# Patient Record
Sex: Male | Born: 1950 | Race: White | Hispanic: No | Marital: Married | Smoking: Smoker, current status unknown
Health system: Southern US, Community
[De-identification: ages and names within clinical notes are randomized; demographics above are authoritative.]

## PROBLEM LIST (undated history)

## (undated) DIAGNOSIS — I10 Essential (primary) hypertension: Secondary | ICD-10-CM

## (undated) DIAGNOSIS — Z950 Presence of cardiac pacemaker: Secondary | ICD-10-CM

## (undated) DIAGNOSIS — I219 Acute myocardial infarction, unspecified: Secondary | ICD-10-CM

## (undated) DIAGNOSIS — I495 Sick sinus syndrome: Secondary | ICD-10-CM

## (undated) DIAGNOSIS — I2510 Atherosclerotic heart disease of native coronary artery without angina pectoris: Secondary | ICD-10-CM

## (undated) DIAGNOSIS — M1990 Unspecified osteoarthritis, unspecified site: Secondary | ICD-10-CM

## (undated) HISTORY — PX: OTHER SURGICAL HISTORY: SHX169

## (undated) HISTORY — PX: ELBOW SURGERY: SHX618

## (undated) HISTORY — PX: JOINT REPLACEMENT: SHX530

## (undated) HISTORY — PX: HAMMER TOE SURGERY: SHX385

## (undated) HISTORY — PX: TONSILLECTOMY: SUR1361

---

## 2011-09-20 ENCOUNTER — Encounter (HOSPITAL_COMMUNITY): Payer: Self-pay | Admitting: Pharmacy Technician

## 2011-09-20 ENCOUNTER — Encounter (HOSPITAL_COMMUNITY): Payer: Self-pay

## 2011-09-20 ENCOUNTER — Encounter (HOSPITAL_COMMUNITY)
Admission: RE | Admit: 2011-09-20 | Discharge: 2011-09-20 | Disposition: A | Discharge status: Home / Self Care | Payer: PRIVATE HEALTH INSURANCE | Source: Ambulatory Visit | Attending: Orthopedic Surgery | Admitting: Orthopedic Surgery

## 2011-09-20 HISTORY — DX: Essential (primary) hypertension: I10

## 2011-09-20 HISTORY — DX: Unspecified osteoarthritis, unspecified site: M19.90

## 2011-09-20 NOTE — Pre-Procedure Instructions (Signed)
NORMAL CARDIOLITE STRESS TEST REPORT 08/08/11--REPORT ON CHART FROM [facility] 4 EKG'S FROM [facility] DATED 08/07/11 ON THIS CHART CXR REPORT 08/07/11 ON CHART FROM [facility] CBC WITH DIFF, CMET, AND URINALYSIS REPORTS ON CHART FROM DR. GRISSO WITH [facility] IN [place]--AND MEDICAL CLEARANCE NOTE FOR RIGHT TKA SURGERY SCHEDULED ON 09/29/11 WITH DR. OLIN PT, PTT AND PCR WERE DONE TODAY PREOP AT [facility]--PT WANTS T/S DRAWN ON ARRIVAL FOR HIS SURGERY.

## 2011-09-20 NOTE — Patient Instructions (Signed)
20 SUHAN PACI  09/20/2011   Your procedure is scheduled on:  Friday 12/21  AT 16:30  Report to [facility] at 2:00 PM.  Call this number if you have problems the morning of surgery: [phone]     Do not eat food:After Midnight.  May have clear liquids FROM MIDNIGHT UNTIL 10:OO AM THE DAY OF SURGERY  Clear liquids include soda, tea, black coffee, apple or grape juice.  NOTHING TO DRINK AFTER 10:OO AM DAY OF SURGERY     Do not wear jewelry, make-up or nail polish.  Do not wear lotions, powders, or perfumes. You may wear deodorant.  Do not shave 48 hours prior to surgery.  Do not bring valuables to the hospital.  Contacts, dentures or bridgework may not be worn into surgery.  Leave suitcase in the car. After surgery it may be brought to your room.  For patients admitted to the hospital, checkout time is 11:00 AM the day of discharge.   Patients discharged the day of surgery will not be allowed to drive home.    Special Instructions: CHG Shower Use Special Wash: 1/2 bottle night before surgery and 1/2 bottle morning of surgery.   Please read over the following fact sheets that you were given: Blood Transfusion Information and MRSA Information AND INCENTIVE SPIROMETER INSTRUCTIONS
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
## 2011-09-29 ENCOUNTER — Encounter (HOSPITAL_COMMUNITY): Payer: Self-pay

## 2011-09-29 ENCOUNTER — Encounter (HOSPITAL_COMMUNITY): Payer: Self-pay | Admitting: Anesthesiology

## 2011-09-29 ENCOUNTER — Inpatient Hospital Stay (HOSPITAL_COMMUNITY)
Admission: RE | Admit: 2011-09-29 | Discharge: 2011-10-01 | DRG: 470 | Disposition: A | Discharge status: Home Health | Payer: PRIVATE HEALTH INSURANCE | Source: Ambulatory Visit | Attending: Orthopedic Surgery | Admitting: Orthopedic Surgery

## 2011-09-29 ENCOUNTER — Encounter (HOSPITAL_COMMUNITY)
Admission: RE | Disposition: A | Discharge status: Home Health | Payer: Self-pay | Source: Ambulatory Visit | Attending: Orthopedic Surgery

## 2011-09-29 ENCOUNTER — Inpatient Hospital Stay (HOSPITAL_COMMUNITY): Payer: PRIVATE HEALTH INSURANCE | Admitting: Anesthesiology

## 2011-09-29 ENCOUNTER — Encounter (HOSPITAL_COMMUNITY): Payer: Self-pay | Admitting: *Deleted

## 2011-09-29 DIAGNOSIS — Z96659 Presence of unspecified artificial knee joint: Secondary | ICD-10-CM

## 2011-09-29 DIAGNOSIS — I10 Essential (primary) hypertension: Secondary | ICD-10-CM | POA: Diagnosis present

## 2011-09-29 DIAGNOSIS — F17200 Nicotine dependence, unspecified, uncomplicated: Secondary | ICD-10-CM | POA: Diagnosis present

## 2011-09-29 DIAGNOSIS — Z01812 Encounter for preprocedural laboratory examination: Secondary | ICD-10-CM

## 2011-09-29 DIAGNOSIS — M1710 Unilateral primary osteoarthritis, unspecified knee: Principal | ICD-10-CM | POA: Diagnosis present

## 2011-09-29 HISTORY — PX: TOTAL KNEE ARTHROPLASTY: SHX125

## 2011-09-29 LAB — TYPE AND SCREEN
ABO/RH(D): A POS
Antibody Screen: NEGATIVE

## 2011-09-29 SURGERY — ARTHROPLASTY, KNEE, TOTAL
Anesthesia: Spinal | Site: Knee | Laterality: Right | Wound class: Clean

## 2011-09-29 MED ORDER — RIVAROXABAN 10 MG PO TABS
10.0000 mg | ORAL_TABLET | ORAL | Status: DC
Start: 2011-09-30 — End: 2011-10-01
  Administered 2011-09-30 – 2011-10-01 (×2): 10 mg via ORAL
  Filled 2011-09-29 (×2): qty 1

## 2011-09-29 MED ORDER — FERROUS SULFATE 325 (65 FE) MG PO TABS
325.0000 mg | ORAL_TABLET | Freq: Three times a day (TID) | ORAL | Status: DC
  Administered 2011-09-29 – 2011-10-01 (×6): 325 mg via ORAL
  Filled 2011-09-29 (×7): qty 1

## 2011-09-29 MED ORDER — ONDANSETRON HCL 4 MG/2ML IJ SOLN: 4.0000 mg | Freq: Four times a day (QID) | INTRAMUSCULAR | Status: DC | PRN

## 2011-09-29 MED ORDER — PROMETHAZINE HCL 25 MG/ML IJ SOLN: 6.2500 mg | INTRAMUSCULAR | Status: DC | PRN

## 2011-09-29 MED ORDER — LISINOPRIL 20 MG PO TABS
20.0000 mg | ORAL_TABLET | Freq: Every day | ORAL | Status: DC
  Administered 2011-09-30 – 2011-10-01 (×2): 20 mg via ORAL
  Filled 2011-09-29 (×2): qty 1

## 2011-09-29 MED ORDER — HYDROMORPHONE HCL PF 1 MG/ML IJ SOLN: 0.2500 mg | INTRAMUSCULAR | Status: DC | PRN

## 2011-09-29 MED ORDER — CEFAZOLIN SODIUM 1-5 GM-% IV SOLN
INTRAVENOUS | Status: DC | PRN
  Administered 2011-09-29: 2 g via INTRAVENOUS

## 2011-09-29 MED ORDER — ONDANSETRON HCL 4 MG PO TABS: 4.0000 mg | ORAL_TABLET | Freq: Four times a day (QID) | ORAL | Status: DC | PRN

## 2011-09-29 MED ORDER — ACETAMINOPHEN 325 MG PO TABS: 650.0000 mg | ORAL_TABLET | Freq: Four times a day (QID) | ORAL | Status: DC | PRN

## 2011-09-29 MED ORDER — METOCLOPRAMIDE HCL 5 MG/ML IJ SOLN: 5.0000 mg | Freq: Three times a day (TID) | INTRAMUSCULAR | Status: DC | PRN

## 2011-09-29 MED ORDER — BISACODYL 5 MG PO TBEC: 5.0000 mg | DELAYED_RELEASE_TABLET | Freq: Every day | ORAL | Status: DC | PRN

## 2011-09-29 MED ORDER — METHOCARBAMOL 100 MG/ML IJ SOLN
500.0000 mg | Freq: Four times a day (QID) | INTRAVENOUS | Status: DC | PRN
  Administered 2011-09-30 (×2): 500 mg via INTRAVENOUS
  Filled 2011-09-29 (×3): qty 5

## 2011-09-29 MED ORDER — SODIUM CHLORIDE 0.9 % IV SOLN
INTRAVENOUS | Status: DC
  Administered 2011-09-29 – 2011-09-30 (×3): via INTRAVENOUS
  Filled 2011-09-29 (×8): qty 1000

## 2011-09-29 MED ORDER — 0.9 % SODIUM CHLORIDE (POUR BTL) OPTIME
TOPICAL | Status: DC | PRN
  Administered 2011-09-29: 1000 mL

## 2011-09-29 MED ORDER — CEFAZOLIN SODIUM-DEXTROSE 2-3 GM-% IV SOLR
2.0000 g | Freq: Four times a day (QID) | INTRAVENOUS | Status: AC
  Administered 2011-09-29 – 2011-09-30 (×3): 2 g via INTRAVENOUS
  Filled 2011-09-29 (×3): qty 50

## 2011-09-29 MED ORDER — POLYETHYLENE GLYCOL 3350 17 G PO PACK
17.0000 g | PACK | Freq: Two times a day (BID) | ORAL | Status: DC
  Administered 2011-09-29 – 2011-10-01 (×4): 17 g via ORAL
  Filled 2011-09-29 (×5): qty 1

## 2011-09-29 MED ORDER — PROPOFOL 10 MG/ML IV EMUL
INTRAVENOUS | Status: DC | PRN
  Administered 2011-09-29: 75 ug/kg/min via INTRAVENOUS

## 2011-09-29 MED ORDER — DOCUSATE SODIUM 100 MG PO CAPS
100.0000 mg | ORAL_CAPSULE | Freq: Two times a day (BID) | ORAL | Status: DC
  Administered 2011-09-29 – 2011-10-01 (×4): 100 mg via ORAL
  Filled 2011-09-29 (×5): qty 1

## 2011-09-29 MED ORDER — HYDROCODONE-ACETAMINOPHEN 7.5-325 MG PO TABS
1.0000 | ORAL_TABLET | ORAL | Status: DC
  Administered 2011-09-29 – 2011-09-30 (×4): 2 via ORAL
  Administered 2011-09-30 – 2011-10-01 (×8): 1 via ORAL
  Filled 2011-09-29 (×3): qty 1
  Filled 2011-09-29 (×2): qty 2
  Filled 2011-09-29: qty 1
  Filled 2011-09-29: qty 2
  Filled 2011-09-29: qty 1
  Filled 2011-09-29 (×2): qty 2
  Filled 2011-09-29 (×2): qty 1

## 2011-09-29 MED ORDER — BUPIVACAINE HCL 0.75 % IJ SOLN
INTRAMUSCULAR | Status: DC | PRN
  Administered 2011-09-29: 11 mg

## 2011-09-29 MED ORDER — ACETAMINOPHEN 650 MG RE SUPP: 650.0000 mg | Freq: Four times a day (QID) | RECTAL | Status: DC | PRN

## 2011-09-29 MED ORDER — MIDAZOLAM HCL 5 MG/5ML IJ SOLN
INTRAMUSCULAR | Status: DC | PRN
  Administered 2011-09-29: 2 mg via INTRAVENOUS

## 2011-09-29 MED ORDER — KETOROLAC TROMETHAMINE 30 MG/ML IJ SOLN
INTRAMUSCULAR | Status: DC | PRN
  Administered 2011-09-29: 30 mg via INTRAMUSCULAR

## 2011-09-29 MED ORDER — METHOCARBAMOL 500 MG PO TABS
500.0000 mg | ORAL_TABLET | Freq: Four times a day (QID) | ORAL | Status: DC | PRN
  Administered 2011-09-30 – 2011-10-01 (×3): 500 mg via ORAL
  Filled 2011-09-29 (×3): qty 1

## 2011-09-29 MED ORDER — HYDROMORPHONE HCL PF 1 MG/ML IJ SOLN
0.5000 mg | INTRAMUSCULAR | Status: DC | PRN
  Administered 2011-09-29: 0.5 mg via INTRAVENOUS
  Administered 2011-09-30 (×3): 1 mg via INTRAVENOUS
  Filled 2011-09-29 (×4): qty 1

## 2011-09-29 MED ORDER — ZOLPIDEM TARTRATE 5 MG PO TABS: 5.0000 mg | ORAL_TABLET | Freq: Every evening | ORAL | Status: DC | PRN

## 2011-09-29 MED ORDER — PROPOFOL 10 MG/ML IV EMUL
INTRAVENOUS | Status: DC | PRN
  Administered 2011-09-29: 30 mg via INTRAVENOUS

## 2011-09-29 MED ORDER — DIPHENHYDRAMINE HCL 25 MG PO CAPS: 25.0000 mg | ORAL_CAPSULE | Freq: Four times a day (QID) | ORAL | Status: DC | PRN

## 2011-09-29 MED ORDER — CHLORHEXIDINE GLUCONATE 4 % EX LIQD: 60.0000 mL | Freq: Once | CUTANEOUS | Status: DC

## 2011-09-29 MED ORDER — LACTATED RINGERS IV SOLN
INTRAVENOUS | Status: DC
  Administered 2011-09-29: 1000 mL via INTRAVENOUS
  Administered 2011-09-29 (×2): via INTRAVENOUS

## 2011-09-29 MED ORDER — MENTHOL 3 MG MT LOZG
1.0000 | LOZENGE | OROMUCOSAL | Status: DC | PRN
  Filled 2011-09-29: qty 9

## 2011-09-29 MED ORDER — STERILE WATER FOR IRRIGATION IR SOLN
Status: DC | PRN
  Administered 2011-09-29: 1500 mL

## 2011-09-29 MED ORDER — CEFAZOLIN SODIUM-DEXTROSE 2-3 GM-% IV SOLR: 2.0000 g | Freq: Once | INTRAVENOUS | Status: DC

## 2011-09-29 MED ORDER — FLEET ENEMA 7-19 GM/118ML RE ENEM: 1.0000 | ENEMA | Freq: Once | RECTAL | Status: AC | PRN

## 2011-09-29 MED ORDER — BUPIVACAINE-EPINEPHRINE PF 0.25-1:200000 % IJ SOLN
INTRAMUSCULAR | Status: DC | PRN
  Administered 2011-09-29: 60 mL

## 2011-09-29 MED ORDER — METOCLOPRAMIDE HCL 10 MG PO TABS: 5.0000 mg | ORAL_TABLET | Freq: Three times a day (TID) | ORAL | Status: DC | PRN

## 2011-09-29 MED ORDER — PHENOL 1.4 % MT LIQD: 1.0000 | OROMUCOSAL | Status: DC | PRN

## 2011-09-29 MED ORDER — ACETAMINOPHEN 10 MG/ML IV SOLN
INTRAVENOUS | Status: DC | PRN
  Administered 2011-09-29: 1000 mg via INTRAVENOUS

## 2011-09-29 MED ORDER — SODIUM CHLORIDE 0.9 % IR SOLN
Status: DC | PRN
  Administered 2011-09-29: 3000 mL

## 2011-09-29 MED ORDER — ALUM & MAG HYDROXIDE-SIMETH 200-200-20 MG/5ML PO SUSP: 30.0000 mL | ORAL | Status: DC | PRN

## 2011-09-29 SURGICAL SUPPLY — 55 items
BAG ZIPLOCK 12X15 (MISCELLANEOUS) ×2 IMPLANT
BANDAGE ELASTIC 6 VELCRO ST LF (GAUZE/BANDAGES/DRESSINGS) ×2 IMPLANT
BANDAGE ESMARK 6X9 LF (GAUZE/BANDAGES/DRESSINGS) ×1 IMPLANT
BLADE SAW SGTL 13.0X1.19X90.0M (BLADE) ×2 IMPLANT
BNDG ESMARK 6X9 LF (GAUZE/BANDAGES/DRESSINGS) ×2
BOWL SMART MIX CTS (DISPOSABLE) ×2 IMPLANT
CEMENT HV SMART SET (Cement) ×4 IMPLANT
CLOTH BEACON ORANGE TIMEOUT ST (SAFETY) ×2 IMPLANT
CUFF TOURN SGL QUICK 34 (TOURNIQUET CUFF) ×1
CUFF TRNQT CYL 34X4X40X1 (TOURNIQUET CUFF) ×1 IMPLANT
DECANTER SPIKE VIAL GLASS SM (MISCELLANEOUS) ×2 IMPLANT
DERMABOND ADVANCED (GAUZE/BANDAGES/DRESSINGS) ×1
DERMABOND ADVANCED .7 DNX12 (GAUZE/BANDAGES/DRESSINGS) ×1 IMPLANT
DRAPE EXTREMITY T 121X128X90 (DRAPE) ×2 IMPLANT
DRAPE POUCH INSTRU U-SHP 10X18 (DRAPES) ×2 IMPLANT
DRAPE U-SHAPE 47X51 STRL (DRAPES) ×2 IMPLANT
DRSG AQUACEL AG ADV 3.5X10 (GAUZE/BANDAGES/DRESSINGS) ×2 IMPLANT
DRSG AQUACEL AG ADV 3.5X14 (GAUZE/BANDAGES/DRESSINGS) ×2 IMPLANT
DRSG TEGADERM 4X4.75 (GAUZE/BANDAGES/DRESSINGS) ×2 IMPLANT
DURAPREP 26ML APPLICATOR (WOUND CARE) ×2 IMPLANT
ELECT REM PT RETURN 9FT ADLT (ELECTROSURGICAL) ×2
ELECTRODE REM PT RTRN 9FT ADLT (ELECTROSURGICAL) ×1 IMPLANT
EVACUATOR 1/8 PVC DRAIN (DRAIN) ×2 IMPLANT
FACESHIELD LNG OPTICON STERILE (SAFETY) ×14 IMPLANT
GAUZE SPONGE 2X2 8PLY STRL LF (GAUZE/BANDAGES/DRESSINGS) ×1 IMPLANT
GLOVE BIOGEL PI IND STRL 7.5 (GLOVE) ×1 IMPLANT
GLOVE BIOGEL PI IND STRL 8 (GLOVE) ×1 IMPLANT
GLOVE BIOGEL PI INDICATOR 7.5 (GLOVE) ×1
GLOVE BIOGEL PI INDICATOR 8 (GLOVE) ×1
GLOVE ECLIPSE 8.0 STRL XLNG CF (GLOVE) ×2 IMPLANT
GLOVE ORTHO TXT STRL SZ7.5 (GLOVE) ×4 IMPLANT
GOWN STRL NON-REIN LRG LVL3 (GOWN DISPOSABLE) ×2 IMPLANT
HANDPIECE INTERPULSE COAX TIP (DISPOSABLE) ×1
IMMOBILIZER KNEE 20 (SOFTGOODS)
IMMOBILIZER KNEE 20 THIGH 36 (SOFTGOODS) IMPLANT
KIT BASIN OR (CUSTOM PROCEDURE TRAY) ×2 IMPLANT
MANIFOLD NEPTUNE II (INSTRUMENTS) ×2 IMPLANT
NDL SAFETY ECLIPSE 18X1.5 (NEEDLE) ×1 IMPLANT
NEEDLE HYPO 18GX1.5 SHARP (NEEDLE) ×1
NS IRRIG 1000ML POUR BTL (IV SOLUTION) ×4 IMPLANT
PACK TOTAL JOINT (CUSTOM PROCEDURE TRAY) ×2 IMPLANT
POSITIONER SURGICAL ARM (MISCELLANEOUS) ×2 IMPLANT
SET HNDPC FAN SPRY TIP SCT (DISPOSABLE) ×1 IMPLANT
SET PAD KNEE POSITIONER (MISCELLANEOUS) ×2 IMPLANT
SPONGE GAUZE 2X2 STER 10/PKG (GAUZE/BANDAGES/DRESSINGS) ×1
SUCTION FRAZIER 12FR DISP (SUCTIONS) ×2 IMPLANT
SUT MNCRL AB 4-0 PS2 18 (SUTURE) ×2 IMPLANT
SUT VIC AB 1 CT1 36 (SUTURE) ×6 IMPLANT
SUT VIC AB 2-0 CT1 27 (SUTURE) ×3
SUT VIC AB 2-0 CT1 TAPERPNT 27 (SUTURE) ×3 IMPLANT
SYR 50ML LL SCALE MARK (SYRINGE) ×2 IMPLANT
TOWEL OR 17X26 10 PK STRL BLUE (TOWEL DISPOSABLE) ×4 IMPLANT
TRAY FOLEY CATH 14FRSI W/METER (CATHETERS) ×2 IMPLANT
WATER STERILE IRR 1500ML POUR (IV SOLUTION) ×2 IMPLANT
WRAP KNEE MAXI GEL POST OP (GAUZE/BANDAGES/DRESSINGS) ×2 IMPLANT

## 2011-09-29 NOTE — Anesthesia Postprocedure Evaluation (Signed)
  Anesthesia Post-op Note  Patient: Dakota Fernandez  Procedure(s) Performed:  TOTAL KNEE ARTHROPLASTY  Patient Location: PACU  Anesthesia Type: Spinal  Level of Consciousness: oriented and sedated  Airway and Oxygen Therapy: Patient Spontanous Breathing and Patient connected to nasal cannula oxygen  Post-op Pain: none  Post-op Assessment: Post-op Vital signs reviewed, Patient's Cardiovascular Status Stable, Respiratory Function Stable and Patent Airway  Post-op Vital Signs: stable  Complications: No apparent anesthesia complications

## 2011-09-29 NOTE — Interval H&P Note (Signed)
History and Physical Interval Note:  09/29/2011 2:03 PM  Dakota Fernandez  has presented today for surgery, with the diagnosis of Osteoarthritis of Right Knee  The various methods of treatment have been discussed with the patient and family. After consideration of risks, benefits and other options for treatment, the patient has consented to  Procedure(s): RIGHT TOTAL KNEE ARTHROPLASTY as a surgical intervention .  The patients' history has been reviewed, patient examined, no change in status, stable for surgery.  I have reviewed the patients' chart and labs.  Questions were answered to the patient's satisfaction.     [name]

## 2011-09-29 NOTE — Anesthesia Preprocedure Evaluation (Signed)
Anesthesia Evaluation  Patient identified by MRN, date of birth, ID band Patient awake    Reviewed: Allergy & Precautions, H&P , NPO status , Patient's Chart, lab work & pertinent test results, reviewed documented beta blocker date and time   Airway Mallampati: II TM Distance: >3 FB Neck ROM: Full    Dental   Pulmonary neg pulmonary ROS, Current Smoker,    + decreased breath sounds      Cardiovascular hypertension, Pt. on medications Regular Normal Denies cardiac symptoms Given Int Med clearance   Neuro/Psych Negative Neurological ROS  Negative Psych ROS   GI/Hepatic negative GI ROS, Neg liver ROS,   Endo/Other  Negative Endocrine ROS  Renal/GU negative Renal ROS  Genitourinary negative   Musculoskeletal negative musculoskeletal ROS (+)   Abdominal   Peds negative pediatric ROS (+)  Hematology negative hematology ROS (+)   Anesthesia Other Findings   Reproductive/Obstetrics negative OB ROS                           Anesthesia Physical Anesthesia Plan  ASA: III  Anesthesia Plan: Spinal   Post-op Pain Management:    Induction: Intravenous  Airway Management Planned: Mask  Additional Equipment:   Intra-op Plan:   Post-operative Plan:   Informed Consent: I have reviewed the patients History and Physical, chart, labs and discussed the procedure including the risks, benefits and alternatives for the proposed anesthesia with the patient or authorized representative who has indicated his/her understanding and acceptance.     Plan Discussed with: CRNA and Surgeon  Anesthesia Plan Comments:         Anesthesia Quick Evaluation

## 2011-09-29 NOTE — Op Note (Signed)
NAME:  KNOWLEDGE ESCANDON                      MEDICAL RECORD NO.:  [id number]                             FACILITY:  [facility]      PHYSICIAN:  Madlyn Frankel. Charlann Boxer, M.D.  DATE OF BIRTH:  1951-10-01      DATE OF PROCEDURE:  09/29/2011                                     OPERATIVE REPORT         PREOPERATIVE DIAGNOSIS:  Right knee osteoarthritis.      POSTOPERATIVE DIAGNOSIS:  Right knee osteoarthritis.      FINDINGS:  The patient was noted to have complete loss of cartilage and   bone-on-bone arthritis with associated osteophytes in the medial and medial trochlea of the patellofemoral compartments of   the knee with a significant synovitis and associated effusion.      PROCEDURE:  Right total knee replacement.      COMPONENTS USED:  DePuy rotating platform posterior stabilized knee   system, a size 5 femur, 5 tibia, 15 mm insert, and 41 patellar   button.      SURGEON:  Madlyn Frankel. Charlann Boxer, M.D.      ASSISTANT:  Lanney Gins, PA-C.      ANESTHESIA:  Spinal.      SPECIMENS:  None.      COMPLICATION:  None.      DRAINS:  One Hemovac.  EBL: <50cc      TOURNIQUET TIME:   Total Tourniquet Time Documented: Thigh (Right) - 40 minutes .      The patient was stable to the recovery room.      INDICATION FOR PROCEDURE:  STORM SOVINE is a 60 y.o. male patient of   mine.  The patient had been seen, evaluated, and treated conservatively in the   office with medication, activity modification, and injections.  The patient had   radiographic changes of bone-on-bone arthritis with endplate sclerosis and osteophytes noted.      The patient failed conservative measures including medication, injections, and activity modification, and at this point was ready for more definitive measures.   Based on the radiographic changes and failed conservative measures, the patient   decided to proceed with total knee replacement.  Risks of infection,   DVT, component failure, need for revision surgery,  postop course, and   expectations were all   discussed and reviewed.  Consent was obtained for benefit of pain   relief.      PROCEDURE IN DETAIL:  The patient was brought to the operative theater.   Once adequate anesthesia, preoperative antibiotics, 2 gm of Ancef administered, the patient was positioned supine with the right thigh tourniquet placed.  The  right lower extremity was prepped and draped in sterile fashion.  A time-   out was performed identifying the patient, planned procedure, and   extremity.      The right lower extremity was placed in the [facility] leg holder.  The leg was   exsanguinated, tourniquet elevated to 250 mmHg.  A midline incision was   made followed by median parapatellar arthrotomy.  Following initial   exposure, attention was first directed to the  patella.  Precut   measurement was noted to be 25 mm.  I resected down to 15 mm and used a   41 patellar button to restore patellar height as well as cover the cut   surface.      The lug holes were drilled and a metal shim was placed to protect the   patella from retractors and saw blades.      At this point, attention was now directed to the femur.  The femoral   canal was opened with a drill, irrigated to try to prevent fat emboli.  An   intramedullary rod was passed at 3 degrees valgus, 11 mm of bone was   resected off the distal femur due to a pre-operative flexion contracture.  Following this resection, the tibia was   subluxated anteriorly.  Using the extramedullary guide, 10 mm of bone was resected off   the proximal lateral tibia.  We confirmed the gap would be   stable medially and laterally with a 10 mm insert as well as confirmed   the cut was perpendicular in the coronal plane, checking with an alignment rod.      Once this was done, I sized the femur to be a size 5 in the anterior-   posterior dimension, chose a standard component based on medial and   lateral dimension.  The size 5 rotation block  was then pinned in   position anterior referenced using the C-clamp to set rotation.  The   anterior, posterior, and  chamfer cuts were made without difficulty nor   notching making certain that I was along the anterior cortex to help   with flexion gap stability.      The final box cut was made off the lateral aspect of distal femur.      At this point, the tibia was sized to be a size 5, the size 5 tray was   then pinned in position through the medial third of the tubercle,   drilled, and keel punched.  Trial reduction was now carried with a 5 femur,  5 tibia, a 15 mm insert, and the 41 patella botton.  The knee was brought to   extension, full extension with good flexion stability with the patella   tracking through the trochlea without application of pressure.  Given   all these findings, the trial components removed.  Final components were   opened and cement was mixed.  The knee was irrigated with normal saline   solution and pulse lavage.  The synovial lining was   then injected with 0.25% Marcaine with epinephrine and 1 cc of Toradol,   total of 61 cc.      The knee was irrigated.  Final implants were then cemented onto clean and   dried cut surfaces of bone with the knee brought to extension with a 15   mm trial insert.      Once the cement had fully cured, the excess cement was removed   throughout the knee.  I confirmed I was satisfied with the range of   motion and stability, and the final 15 mm insert was chosen.  It was   placed into the knee.      The tourniquet had been let down at 39 minutes.  No significant   hemostasis required.  The medium Hemovac drain was placed deep.  The   extensor mechanism was then reapproximated using #1 Vicryl with the knee   in flexion.  The   remaining wound was closed with 2-0 Vicryl and running 4-0 Monocryl.   The knee was cleaned, dried, dressed sterilely using Dermabond and   Aquacel dressing.  Drain site dressed separately.  The  patient was then   brought to recovery room in stable condition, tolerating the procedure   well.   Please note that Physician Assistant, Lanney Gins, was present for the entirety of the case, and was utilized for pre-operative positioning, peri-operative retractor management, general facilitation of the procedure.  He was also utilized for primary wound closure at the end of the case.              Madlyn Frankel Charlann Boxer, M.D.

## 2011-09-29 NOTE — Plan of Care (Signed)
Problem: Consults Goal: Diagnosis- Total Joint Replacement Outcome: Completed/Met Date Met:  09/29/11 Right total knee

## 2011-09-29 NOTE — Anesthesia Procedure Notes (Signed)
Spinal  Patient location during procedure: OR Start time: 09/29/2011 2:10 PM End time: 09/29/2011 2:17 PM Staffing Anesthesiologist: Lestine Box B Performed by: anesthesiologist  Preanesthetic Checklist Completed: patient identified, site marked, surgical consent, pre-op evaluation, timeout performed, IV checked, risks and benefits discussed and monitors and equipment checked Spinal Block Patient position: sitting Prep: Betadine Patient monitoring: heart rate, cardiac monitor, continuous pulse ox and blood pressure Approach: midline Location: L3-4 Injection technique: single-shot Needle Needle type: Quincke  Needle gauge: 25 G Needle length: 9 cm Needle insertion depth: 3 cm Assessment Sensory level: T6 Additional Notes [id number], 2013-11

## 2011-09-29 NOTE — Transfer of Care (Signed)
Immediate Anesthesia Transfer of Care Note  Patient: Dakota Fernandez  Procedure(s) Performed:  TOTAL KNEE ARTHROPLASTY  Patient Location: PACU  Anesthesia Type: General  Level of Consciousness: sedated, patient cooperative and responds to stimulaton  Airway & Oxygen Therapy: Patient Spontanous Breathing and Patient connected to face mask oxgen  Post-op Assessment: Report given to PACU RN and Post -op Vital signs reviewed and stable  Post vital signs: Reviewed and stable  Complications: No apparent anesthesia complications

## 2011-09-30 LAB — CBC
HCT: 37.5 % — ABNORMAL LOW (ref 39.0–52.0)
MCHC: 33.3 g/dL (ref 30.0–36.0)
Platelets: 244 10*3/uL (ref 150–400)
RDW: 13.4 % (ref 11.5–15.5)
WBC: 12 10*3/uL — ABNORMAL HIGH (ref 4.0–10.5)

## 2011-09-30 LAB — BASIC METABOLIC PANEL
BUN: 18 mg/dL (ref 6–23)
Chloride: 105 mEq/L (ref 96–112)
GFR calc Af Amer: 83 mL/min — ABNORMAL LOW (ref >90)
GFR calc non Af Amer: 72 mL/min — ABNORMAL LOW (ref >90)
Potassium: 4 mEq/L (ref 3.5–5.1)

## 2011-09-30 MED ORDER — DEXAMETHASONE SODIUM PHOSPHATE 10 MG/ML IJ SOLN
10.0000 mg | Freq: Once | INTRAMUSCULAR | Status: AC
  Administered 2011-09-30: 10 mg via INTRAVENOUS
  Filled 2011-09-30: qty 1

## 2011-09-30 NOTE — Progress Notes (Signed)
Physical Therapy Evaluation Patient Details Name: Dakota Fernandez MRN: [id number] DOB: Feb 29, 1951 Today's Date: 09/30/2011 8:54-9:21, EV2  Problem List:  Patient Active Problem List  Diagnoses  . S/P right total knee replacement    Past Medical History:  Past Medical History  Diagnosis Date  . Hypertension     recent problem with low b/p and heart rate while pt on metoprolol--was given  stress test at [place]--normal--pt's b/p med changed to lisinopril and pt feeling better and has ok for knee replacement with dr Charlann Boxer  . Arthritis     oa both knees--pt states needs both knees replaced-plans right knee first   Past Surgical History:  Past Surgical History  Procedure Date  . Right knee arthroscopy   . Hammer toe surgery     right foot  . Elbow surgery     for ganglion cyst  . Surgery for blood poisoning left arm     hx of splinter left little finger--that caused infection in left arm    PT Assessment/Plan/Recommendation PT Assessment Clinical Impression Statement: 60 y/o WM s/p R TKA who did well with PT eval.  He should progress well in acute care for anticipated d/c to mother's house with HHPT. PT Recommendation/Assessment: Patient will need skilled PT in the acute care venue PT Problem List: Decreased strength;Decreased range of motion;Decreased mobility;Pain;Decreased knowledge of use of DME PT Therapy Diagnosis : Difficulty walking PT Plan PT Treatment/Interventions: Therapeutic exercise;Therapeutic activities;Functional mobility training;Gait training;DME instruction PT Recommendation Follow Up Recommendations: Home health PT Equipment Recommended: Rolling walker with 5" wheels PT Goals  Acute Rehab PT Goals PT Goal Formulation: With patient Time For Goal Achievement: 7 days Pt will go Supine/Side to Sit: with modified independence PT Goal: Supine/Side to Sit - Progress: Not met Pt will go Sit to Stand: with modified independence PT Goal: Sit to Stand -  Progress: Not met Pt will go Stand to Sit: with modified independence PT Goal: Stand to Sit - Progress: Not met Pt will Ambulate: >150 feet;with supervision;with rolling walker PT Goal: Ambulate - Progress: Not met Pt will Perform Home Exercise Program: with supervision, verbal cues required/provided PT Goal: Perform Home Exercise Program - Progress: Not met  PT Evaluation Precautions/Restrictions  Precautions Precautions: Knee Required Braces or Orthoses: Yes Knee Immobilizer: Discontinue once straight leg raise with < 10 degree lag Restrictions Weight Bearing Restrictions: No RLE Weight Bearing: Weight bearing as tolerated Prior Functioning  Home Living Lives With: Spouse Receives Help From: Family Type of Home: House Home Layout: One level Home Access: Level entry Bathroom Shower/Tub: Engineer, manufacturing systems: Standard Home Adaptive Equipment: None Prior Function Level of Independence: Independent with basic ADLs;Independent with homemaking with ambulation;Independent with gait;Independent with transfers Driving: Yes Vocation: Full time employment Cognition Cognition Arousal/Alertness: Awake/alert Overall Cognitive Status: Appears within functional limits for tasks assessed Orientation Level: Oriented X4   Extremity Assessment  RLE Assessment RLE Assessment:  (R knee AAROM 35 degrees, fair + quad set) LLE Assessment LLE Assessment: Within Functional Limits Mobility (including Balance) Bed Mobility Bed Mobility: Yes Supine to Sit: 5: Supervision;HOB elevated (Comment degrees);With rails Supine to Sit Details (indicate cue type and reason): used rail to A Transfers Transfers: Yes Sit to Stand: 4: Min assist Sit to Stand Details (indicate cue type and reason): cues for UE & LE position Stand to Sit: 4: Min assist Stand to Sit Details: cues for UE & LE position Ambulation/Gait Ambulation/Gait: Yes Ambulation/Gait Assistance: 4: Min assist Ambulation/Gait  Assistance Details (indicate cue  type and reason): MIN/GUARD, cues for proper sequencing Ambulation Distance (Feet): 70 Feet Assistive device: Rolling walker Gait Pattern: Step-to pattern    Exercise  Total Joint Exercises Ankle Circles/Pumps: AROM;Both;10 reps;Supine Quad Sets: AROM;Strengthening;Right;10 reps;Supine Heel Slides: AAROM;Right;10 reps;Supine Straight Leg Raises: AAROM;10 reps;Right;Supine End of Session PT - End of Session Equipment Utilized During Treatment: Gait belt;Right knee immobilizer Activity Tolerance: Patient tolerated treatment well Patient left: in chair Nurse Communication: Mobility status for transfers;Mobility status for ambulation General Behavior During Session: [facility] for tasks performed Cognition: [facility] for tasks performed  [facility] LUBECK 09/30/2011, 9:36 AM

## 2011-09-30 NOTE — Progress Notes (Signed)
Patient ID: Dakota Fernandez, male   DOB: [birth year], 60 y.o.   MRN: [id number] Subjective: 1 Day Post-Op Procedure(s) (LRB): TOTAL KNEE ARTHROPLASTY (Right)    Patient reports pain as mild. Seems to be doing fairly well this am. No events no major complaints  Objective:   VITALS:   Filed Vitals:   09/30/11 0517  BP: 100/63  Pulse: 69  Temp: 98.2 F (36.8 C)  Resp: 14    Neurovascular intact Incision: dressing C/D/I Compartment soft  LABS  Basename 09/30/11 0443  HGB 12.5*  HCT 37.5*  WBC 12.0*  PLT 244     Basename 09/30/11 0443  NA 137  K 4.0  BUN 18  CREATININE 1.10  GLUCOSE 108*    No results found for this basename: LABPT:2,INR:2 in the last 72 hours   Assessment/Plan: 1 Day Post-Op Procedure(s) (LRB): TOTAL KNEE ARTHROPLASTY (Right)   Advance diet Up with therapy D/C IV fluids Plan for discharge tomorrow

## 2011-09-30 NOTE — Progress Notes (Signed)
Occupational Therapy Evaluation Patient Details Name: Dakota Fernandez MRN: [id number] DOB: [birth year] Today's Date: 09/30/2011 EV1 [id number] Problem List:  Patient Active Problem List  Diagnoses  . S/P right total knee replacement    Past Medical History:  Past Medical History  Diagnosis Date  . Hypertension     recent problem with low b/p and heart rate while pt on metoprolol--was given  stress test at [place]--normal--pt's b/p med changed to lisinopril and pt feeling better and has ok for knee replacement with dr Charlann Boxer  . Arthritis     oa both knees--pt states needs both knees replaced-plans right knee first   Past Surgical History:  Past Surgical History  Procedure Date  . Right knee arthroscopy   . Hammer toe surgery     right foot  . Elbow surgery     for ganglion cyst  . Surgery for blood poisoning left arm     hx of splinter left little finger--that caused infection in left arm    OT Assessment/Plan/Recommendation OT Assessment Clinical Impression Statement: Pt presents with new R TKR & is mobilizing very well. Pt will have prn A from family following d/c OT Recommendation/Assessment: Patient does not need any further OT services. Recommend 3:1 for home use. OT Goals    OT Evaluation Precautions/Restrictions  Precautions Precautions: Knee Required Braces or Orthoses: Yes Knee Immobilizer: Discontinue once straight leg raise with < 10 degree lag Restrictions Weight Bearing Restrictions: No RLE Weight Bearing: Weight bearing as tolerated Prior Functioning Home Living Lives With: Spouse Receives Help From: Family Type of Home: House Home Layout: One level Home Access: Level entry Bathroom Shower/Tub: Engineer, manufacturing systems: Standard Home Adaptive Equipment: None Prior Function Level of Independence: Independent with basic ADLs;Independent with homemaking with ambulation;Independent with gait;Independent with transfers Driving: Yes Vocation:  Full time employment ADL ADL Grooming: Simulated;Set up Where Assessed - Grooming: Sitting, bed;Unsupported Upper Body Bathing: Simulated;Set up Where Assessed - Upper Body Bathing: Sitting, bed;Unsupported Lower Body Bathing: Minimal assistance Where Assessed - Lower Body Bathing: Sit to stand from bed Upper Body Dressing: Performed;Set up Where Assessed - Upper Body Dressing: Sitting, bed;Unsupported Lower Body Dressing: Simulated;Minimal assistance Where Assessed - Lower Body Dressing: Sit to stand from bed Toilet Transfer: Performed;Supervision/safety Toilet Transfer Details (indicate cue type and reason): cues for UE/LE position, manipulating RW around bathroom Toilet Transfer Method: Ambulating Toilet Transfer Equipment: Raised toilet seat with arms (or 3-in-1 over toilet) Toileting - Clothing Manipulation: Simulated;Supervision/safety Where Assessed - Toileting Clothing Manipulation: Sit to stand from 3-in-1 or toilet Toileting - Hygiene: Supervision/safety Where Assessed - Toileting Hygiene: Sit to stand from 3-in-1 or toilet Tub/Shower Transfer: Not assessed Tub/Shower Transfer Method: Not assessed Equipment Used: Rolling walker (3:1) ADL Comments: Pt doing very well & should likely be able to step into bathtub within a few days. Pt stated he would spongebathe until then. Vision/Perception  Vision - History Baseline Vision: Wears glasses only for reading Patient Visual Report: No change from baseline Vision - Assessment Vision Assessment: Vision not tested Cognition Cognition Arousal/Alertness: Awake/alert Overall Cognitive Status: Appears within functional limits for tasks assessed Orientation Level: Oriented X4 Sensation/Coordination   Extremity Assessment RUE Assessment RUE Assessment: Within Functional Limits LUE Assessment LUE Assessment: Within Functional Limits Mobility  Bed Mobility Bed Mobility: Yes Supine to Sit: 5: Supervision;HOB elevated (Comment  degrees);With rails Supine to Sit Details (indicate cue type and reason): used rail to A Transfers Sit to Stand: 4: Min assist Sit to Stand Details (indicate cue  type and reason): cues for UE & LE position Stand to Sit: 4: Min assist Stand to Sit Details: cues for UE & LE position Exercises Total Joint Exercises Ankle Circles/Pumps: AROM;Both;10 reps;Supine Quad Sets: AROM;Strengthening;Right;10 reps;Supine Heel Slides: AAROM;Right;10 reps;Supine Straight Leg Raises: AAROM;10 reps;Right;Supine End of Session OT - End of Session Equipment Utilized During Treatment: Gait belt;Other (comment) (RW, 3:1) Activity Tolerance: Patient tolerated treatment well Patient left: in chair;with call bell in reach General Behavior During Session: [facility] for tasks performed Cognition: [facility] for tasks performed   Estalene Bergey A 09/30/2011, 9:33 AM [phone]

## 2011-09-30 NOTE — Progress Notes (Signed)
Physical Therapy Treatment Patient Details Name: Dakota Fernandez MRN: [id number] DOB: 01/31/1951 Today's Date: 09/30/2011 12:26-12:45, gt PT Assessment/Plan  PT - Assessment/Plan Comments on Treatment Session: Pt s/p R TKA and is making good progress with PT. PT Plan: Discharge plan remains appropriate PT Frequency: 7X/week Follow Up Recommendations: Home health PT Equipment Recommended: Rolling walker with 5" wheels PT Goals  Acute Rehab PT Goals PT Goal Formulation: With patient Time For Goal Achievement: 7 days Pt will go Supine/Side to Sit: with modified independence PT Goal: Supine/Side to Sit - Progress: Not met Pt will go Sit to Stand: with modified independence PT Goal: Sit to Stand - Progress: Progressing toward goal Pt will go Stand to Sit: with modified independence PT Goal: Stand to Sit - Progress: Progressing toward goal Pt will Ambulate: >150 feet;with supervision;with rolling walker PT Goal: Ambulate - Progress: Progressing toward goal Pt will Perform Home Exercise Program: with supervision, verbal cues required/provided PT Goal: Perform Home Exercise Program - Progress: Not met  PT Treatment Precautions/Restrictions  Precautions Precautions: Knee Required Braces or Orthoses: Yes Knee Immobilizer: Discontinue once straight leg raise with < 10 degree lag Restrictions Weight Bearing Restrictions: No RLE Weight Bearing: Weight bearing as tolerated Mobility (including Balance) Transfers Transfers: Yes Sit to Stand: 5: Supervision Sit to Stand Details (indicate cue type and reason): cues for UE & LE position Stand to Sit: 4: Min assist Stand to Sit Details: MIN/Guard Ambulation/Gait Ambulation/Gait: Yes Ambulation/Gait Assistance: 4: Min assist (min/guard) Ambulation/Gait Assistance Details (indicate cue type and reason): min/guard, cues to keep RW closer and for posture Ambulation Distance (Feet): 135 Feet Assistive device: Rolling walker Gait Pattern:  Step-to pattern    End of Session PT - End of Session Equipment Utilized During Treatment: Gait belt;Right knee immobilizer Activity Tolerance: Patient tolerated treatment well Patient left: in chair Nurse Communication:  (request for pain meds) General Behavior During Session: [facility] for tasks performed Cognition: [facility] for tasks performed  [facility] LUBECK 09/30/2011, 1:22 PM

## 2011-10-01 LAB — BASIC METABOLIC PANEL
Chloride: 103 mEq/L (ref 96–112)
GFR calc Af Amer: >90 mL/min (ref >90)
GFR calc non Af Amer: 89 mL/min — ABNORMAL LOW (ref >90)
Glucose, Bld: 157 mg/dL — ABNORMAL HIGH (ref 70–99)
Potassium: 4 mEq/L (ref 3.5–5.1)
Sodium: 136 mEq/L (ref 135–145)

## 2011-10-01 LAB — CBC
HCT: 34.5 % — ABNORMAL LOW (ref 39.0–52.0)
Hemoglobin: 11.7 g/dL — ABNORMAL LOW (ref 13.0–17.0)
MCHC: 33.9 g/dL (ref 30.0–36.0)
RDW: 13.2 % (ref 11.5–15.5)
WBC: 16.2 10*3/uL — ABNORMAL HIGH (ref 4.0–10.5)

## 2011-10-01 MED ORDER — HYDROCODONE-ACETAMINOPHEN 7.5-325 MG PO TABS: 1.0000 | ORAL_TABLET | ORAL | Status: AC | PRN

## 2011-10-01 MED ORDER — ASPIRIN EC 81 MG PO TBEC: 81.0000 mg | DELAYED_RELEASE_TABLET | ORAL | Status: AC

## 2011-10-01 NOTE — Progress Notes (Signed)
Patient stable; discharged home with his brother and sister-in-law.  Transported via wheelchair to private vehicle by NT.

## 2011-10-01 NOTE — Plan of Care (Signed)
Problem: Phase III Progression Outcomes Goal: Incision clean - minimal/no drainage Outcome: Completed/Met Date Met:  10/01/11 ACE removed by Dr. Charlann Boxer; Aquacell intact. Goal: Anticoagulant follow-up in place Outcome: Completed/Met Date Met:  10/01/11 EC ASA 325 mg po BID x 4 weeks.

## 2011-10-01 NOTE — Progress Notes (Signed)
Cm to provide spoke with pt concerning d/c planning. [facility] to provide HHPT and DME. Pt request rw and 3n1. Spouse to assist in pt care. Pt to have HHPT at mother's residence.  Weekend case manager [phone]

## 2011-10-01 NOTE — Progress Notes (Signed)
Patient ID: Dakota Fernandez, male   DOB: [birth year], 60 y.o.   MRN: [id number] POD # 2 from Right TKR  Subjective: Doing well, no events. Ready to go home with HHPT Understands what is ahead of him   Objective: Vital signs in last 24 hours: Temp:  [97.5 F (36.4 C)-98.2 F (36.8 C)] 98.2 F (36.8 C) (12/23 0641) Pulse Rate:  [68-77] 77  (12/23 0641) Resp:  [18-20] 20  (12/23 0641) BP: (118-134)/(69-82) 128/69 mmHg (12/23 0641) SpO2:  [96 %-97 %] 96 % (12/23 0641)   Basename 10/01/11 0547 09/30/11 0443  HGB 11.7* 12.5*    Basename 10/01/11 0547 09/30/11 0443  WBC 16.2* 12.0*  RBC 3.68* 3.93*  HCT 34.5* 37.5*  PLT 241 244    Basename 10/01/11 0547 09/30/11 0443  NA 136 137  K 4.0 4.0  CL 103 105  CO2 26 26  BUN 14 18  CREATININE 0.97 1.10  GLUCOSE 157* 108*  CALCIUM 9.0 8.4   No results found for this basename: LABPT:2,INR:2 in the last 72 hours  Neurovascular intact Incision: dressing C/D/I + quad function but difficulty with SLR  Assessment/Plan: PT/OT  D/C to home with HHPT after therapy today     Dakota Fernandez D 10/01/2011, 9:40 AM

## 2011-10-01 NOTE — Discharge Summary (Signed)
Physician Discharge Summary  Patient ID: Dakota Fernandez MRN: [id number] DOB/AGE: 1950-11-01 60 y.o.  Admit date: 09/29/2011 Discharge date: 10/01/2011  Procedures:  Procedure(s) (LRB): TOTAL KNEE ARTHROPLASTY (Right)  Attending Physician: [name]   Admission Diagnoses: right knee OA, arthritis    Discharge Diagnoses:  Principal Problem:  *S/P right total knee replacement Right knee arthritis hypertension  PCP: No primary provider on file.   Discharged Condition: good  Hospital Course:  Patient underwent the above stated procedure on 09/29/2011. Patient tolerated the procedure well and brought to the recovery room in good condition and subsequently to the floor.  POD #1 AF, VSS Pt's foley was removed, as well as the hemovac drain removed. IV was changed to a saline lock. He was seen and evaluated by PT/OT and did well. No events or complications  POD #2   Feeling fine and ready to go home with HHPT No events, labs stable   Discharge Exam: General appearance: alert, cooperative and no distress Extremities: edema minimal, Homans sign is negative, no sign of DVT and no edema, redness or tenderness in the calves or thighs Incision/Wound: Post operative dressing dry and intact Intact quad function but limited SLR  Disposition: Final discharge disposition not confirmed  Discharge Orders    Future Orders Please Complete By Expires   Diet - low sodium heart healthy      Call MD / Call 911      Comments:   If you experience chest pain or shortness of breath, CALL 911 and be transported to the hospital emergency room.  If you develope a fever above 101 F, pus (white drainage) or increased drainage or redness at the wound, or calf pain, call your surgeon's office.   Constipation Prevention      Comments:   Drink plenty of fluids.  Prune juice may be helpful.  You may use a stool softener, such as Colace (over the counter) 100 mg twice a day.  Use MiraLax (over  the counter) for constipation as needed.   Increase activity slowly as tolerated      Weight Bearing as taught in Physical Therapy      Comments:   Use a walker or crutches as instructed.   Discharge instructions      Comments:   Work on range of motion, both extension and flexion 10-15 every hour particularly if therapy not started immediately. Knee motion and function will not return without effort  May shower whenever you would like, current dressing is water proof.  Return to see Dr. Charlann Boxer in 2 weeks Remove post operative dressing 8 days after surgery then keep wound covered with gauze and tape   Driving restrictions      Comments:   No driving for 4 weeks   TED hose      Comments:   Use stockings (TED hose) for 2 weeks on both leg(s).  You may remove them at night for sleeping.   Do not put a pillow under the knee. Place it under the heel.        Current Discharge Medication List    START taking these medications   Details  HYDROcodone-acetaminophen (NORCO) 7.5-325 MG per tablet Take 1-2 tablets by mouth every 4 (four) hours as needed for pain. Qty: 100 tablet, Refills: 0      CONTINUE these medications which have CHANGED   Details  aspirin EC 81 MG tablet Take 1 tablet (81 mg total) by mouth every morning. Qty: 100  tablet, Refills: 0      CONTINUE these medications which have NOT CHANGED   Details  cholecalciferol (VITAMIN D) 1000 UNITS tablet Take 2,000 Units by mouth daily.     !! fish oil-omega-3 fatty acids 1000 MG capsule Take 1 g by mouth daily.     ibuprofen (ADVIL,MOTRIN) 200 MG tablet Take 400 mg by mouth every 6 (six) hours as needed. PAIN      !! lisinopril (PRINIVIL,ZESTRIL) 2.5 MG tablet Take 20 mg by mouth every morning.     !! lisinopril (PRINIVIL,ZESTRIL) 20 MG tablet Take 20 mg by mouth daily.      !! Omega-3 Fatty Acids (FISH OIL) 1200 MG CAPS Take 1,200 mg by mouth 1 day or 1 dose.      OVER THE COUNTER MEDICATION Take 2 tablets by mouth at  bedtime. ULTIMATE COLON CARE       !! - Potential duplicate medications found. Please discuss with provider.       Signed: Madlyn Frankel. Charlann Boxer, MD  10/01/2011, 9:57 AM

## 2011-10-01 NOTE — Progress Notes (Signed)
Physical Therapy Treatment Patient Details Name: Dakota Fernandez MRN: [id number] DOB: 1951-05-19 Today's Date: 10/01/2011  11:45-12:10 G, TE  PT Assessment/Plan  PT - Assessment/Plan Comments on Treatment Session: Pt did well with PT today. Did stair training with pt and family, reviewed HEP, encouraged frequent ambulation. REady to DC home with HHPT from PT standpoint.  PT Plan: Discharge plan remains appropriate PT Frequency: 7X/week Follow Up Recommendations: Home health PT Equipment Recommended: Rolling walker with 5" wheels PT Goals  Acute Rehab PT Goals PT Goal Formulation: With patient Time For Goal Achievement: 7 days Pt will go Supine/Side to Sit: with modified independence Pt will go Sit to Stand: with modified independence PT Goal: Sit to Stand - Progress: Met Pt will go Stand to Sit: with modified independence PT Goal: Stand to Sit - Progress: Met Pt will Ambulate: >150 feet;with supervision;with rolling walker PT Goal: Ambulate - Progress: Met Pt will Perform Home Exercise Program: with supervision, verbal cues required/provided PT Goal: Perform Home Exercise Program - Progress: Met Additional Goals Additional Goal #1: Ascend/descend 3 stairs with B rails with Supervision PT Goal: Additional Goal #1 - Progress: Met  PT Treatment Precautions/Restrictions  Precautions Precautions: Knee Required Braces or Orthoses: Yes Knee Immobilizer: Discontinue once straight leg raise with < 10 degree lag Restrictions Weight Bearing Restrictions: No RLE Weight Bearing: Weight bearing as tolerated Mobility (including Balance) Bed Mobility Bed Mobility: No Transfers Sit to Stand: 6: Modified independent (Device/Increase time);From chair/3-in-1;With armrests;With upper extremity assist;From elevated surface Stand to Sit: 6: Modified independent (Device/Increase time);To chair/3-in-1;To elevated surface;With armrests;With upper extremity  assist Ambulation/Gait Ambulation/Gait: Yes Ambulation/Gait Assistance: 6: Modified independent (Device/Increase time) Ambulation Distance (Feet): 200 Feet Assistive device: Rolling walker Gait Pattern: Step-to pattern Stairs: Yes Stairs Assistance: 5: Supervision Stairs Assistance Details (indicate cue type and reason): VCs sequencing Stair Management Technique: Two rails;Step to pattern Number of Stairs: 4     Exercise  Total Joint Exercises Ankle Circles/Pumps: AROM;Both;10 reps;Supine Quad Sets: AROM;Strengthening;Right;10 reps;Supine Heel Slides: AAROM;Right;10 reps;Supine Straight Leg Raises: AAROM;10 reps;Right;Supine End of Session PT - End of Session Equipment Utilized During Treatment: Gait belt;Right knee immobilizer Activity Tolerance: Patient tolerated treatment well Patient left: in chair;with call bell in reach;with family/visitor present Nurse Communication: Mobility status for transfers;Mobility status for ambulation General Behavior During Session: [facility] for tasks performed Cognition: [facility] for tasks performed  Tamala Ser 10/01/2011, 12:29 PM  Tamala Ser PT 10/01/2011  [phone]

## 2011-10-02 NOTE — Progress Notes (Signed)
Utilization review completed.  

## 2011-10-04 ENCOUNTER — Encounter (HOSPITAL_COMMUNITY): Payer: Self-pay | Admitting: Orthopedic Surgery

## 2016-05-24 DIAGNOSIS — R001 Bradycardia, unspecified: Secondary | ICD-10-CM

## 2016-05-24 DIAGNOSIS — F411 Generalized anxiety disorder: Secondary | ICD-10-CM | POA: Diagnosis not present

## 2016-05-24 DIAGNOSIS — F1190 Opioid use, unspecified, uncomplicated: Secondary | ICD-10-CM

## 2016-05-24 DIAGNOSIS — R55 Syncope and collapse: Secondary | ICD-10-CM

## 2016-05-24 DIAGNOSIS — I10 Essential (primary) hypertension: Secondary | ICD-10-CM | POA: Diagnosis not present

## 2016-05-25 DIAGNOSIS — F1190 Opioid use, unspecified, uncomplicated: Secondary | ICD-10-CM | POA: Diagnosis not present

## 2016-05-25 DIAGNOSIS — I10 Essential (primary) hypertension: Secondary | ICD-10-CM | POA: Diagnosis not present

## 2016-05-25 DIAGNOSIS — R001 Bradycardia, unspecified: Secondary | ICD-10-CM | POA: Diagnosis not present

## 2016-05-25 DIAGNOSIS — F411 Generalized anxiety disorder: Secondary | ICD-10-CM | POA: Diagnosis not present

## 2016-05-26 DIAGNOSIS — F1190 Opioid use, unspecified, uncomplicated: Secondary | ICD-10-CM | POA: Diagnosis not present

## 2016-05-26 DIAGNOSIS — I10 Essential (primary) hypertension: Secondary | ICD-10-CM | POA: Diagnosis not present

## 2016-05-26 DIAGNOSIS — F411 Generalized anxiety disorder: Secondary | ICD-10-CM | POA: Diagnosis not present

## 2016-05-26 DIAGNOSIS — R001 Bradycardia, unspecified: Secondary | ICD-10-CM | POA: Diagnosis not present

## 2016-05-27 DIAGNOSIS — F411 Generalized anxiety disorder: Secondary | ICD-10-CM | POA: Diagnosis not present

## 2016-05-27 DIAGNOSIS — F1190 Opioid use, unspecified, uncomplicated: Secondary | ICD-10-CM | POA: Diagnosis not present

## 2016-05-27 DIAGNOSIS — I10 Essential (primary) hypertension: Secondary | ICD-10-CM | POA: Diagnosis not present

## 2016-05-27 DIAGNOSIS — R001 Bradycardia, unspecified: Secondary | ICD-10-CM | POA: Diagnosis not present

## 2017-07-20 DIAGNOSIS — F17200 Nicotine dependence, unspecified, uncomplicated: Secondary | ICD-10-CM | POA: Diagnosis not present

## 2017-07-20 DIAGNOSIS — F1190 Opioid use, unspecified, uncomplicated: Secondary | ICD-10-CM | POA: Diagnosis not present

## 2017-07-20 DIAGNOSIS — F411 Generalized anxiety disorder: Secondary | ICD-10-CM | POA: Diagnosis not present

## 2017-07-20 DIAGNOSIS — J441 Chronic obstructive pulmonary disease with (acute) exacerbation: Secondary | ICD-10-CM | POA: Diagnosis not present

## 2017-07-20 DIAGNOSIS — I10 Essential (primary) hypertension: Secondary | ICD-10-CM | POA: Diagnosis not present

## 2017-07-20 DIAGNOSIS — R55 Syncope and collapse: Secondary | ICD-10-CM | POA: Diagnosis not present

## 2017-07-21 DIAGNOSIS — J441 Chronic obstructive pulmonary disease with (acute) exacerbation: Secondary | ICD-10-CM | POA: Diagnosis not present

## 2020-08-22 ENCOUNTER — Emergency Department (HOSPITAL_BASED_OUTPATIENT_CLINIC_OR_DEPARTMENT_OTHER): Payer: Medicare PPO

## 2020-08-22 ENCOUNTER — Encounter (HOSPITAL_BASED_OUTPATIENT_CLINIC_OR_DEPARTMENT_OTHER): Payer: Self-pay | Admitting: Emergency Medicine

## 2020-08-22 ENCOUNTER — Emergency Department (HOSPITAL_BASED_OUTPATIENT_CLINIC_OR_DEPARTMENT_OTHER)
Admission: EM | Admit: 2020-08-22 | Discharge: 2020-08-22 | Disposition: A | Discharge status: Home / Self Care | Payer: Medicare PPO | Attending: Emergency Medicine | Admitting: Emergency Medicine

## 2020-08-22 ENCOUNTER — Other Ambulatory Visit: Payer: Self-pay

## 2020-08-22 DIAGNOSIS — Z96651 Presence of right artificial knee joint: Secondary | ICD-10-CM | POA: Insufficient documentation

## 2020-08-22 DIAGNOSIS — R109 Unspecified abdominal pain: Secondary | ICD-10-CM | POA: Diagnosis present

## 2020-08-22 DIAGNOSIS — I2510 Atherosclerotic heart disease of native coronary artery without angina pectoris: Secondary | ICD-10-CM | POA: Diagnosis not present

## 2020-08-22 DIAGNOSIS — N3000 Acute cystitis without hematuria: Secondary | ICD-10-CM | POA: Diagnosis not present

## 2020-08-22 DIAGNOSIS — M549 Dorsalgia, unspecified: Secondary | ICD-10-CM

## 2020-08-22 DIAGNOSIS — F17210 Nicotine dependence, cigarettes, uncomplicated: Secondary | ICD-10-CM | POA: Diagnosis not present

## 2020-08-22 DIAGNOSIS — I10 Essential (primary) hypertension: Secondary | ICD-10-CM | POA: Insufficient documentation

## 2020-08-22 DIAGNOSIS — M5442 Lumbago with sciatica, left side: Secondary | ICD-10-CM | POA: Diagnosis not present

## 2020-08-22 DIAGNOSIS — M5432 Sciatica, left side: Secondary | ICD-10-CM

## 2020-08-22 HISTORY — DX: Acute myocardial infarction, unspecified: I21.9

## 2020-08-22 HISTORY — DX: Atherosclerotic heart disease of native coronary artery without angina pectoris: I25.10

## 2020-08-22 LAB — CBC
HCT: 37.5 % — ABNORMAL LOW (ref 39.0–52.0)
Hemoglobin: 12.6 g/dL — ABNORMAL LOW (ref 13.0–17.0)
MCH: 32.1 pg (ref 26.0–34.0)
MCHC: 33.6 g/dL (ref 30.0–36.0)
MCV: 95.7 fL (ref 80.0–100.0)
Platelets: 236 10*3/uL (ref 150–400)
RBC: 3.92 MIL/uL — ABNORMAL LOW (ref 4.22–5.81)
RDW: 13.2 % (ref 11.5–15.5)
WBC: 8.3 10*3/uL (ref 4.0–10.5)
nRBC: 0 % (ref 0.0–0.2)

## 2020-08-22 LAB — COMPREHENSIVE METABOLIC PANEL
ALT: 13 U/L (ref 0–44)
AST: 21 U/L (ref 15–41)
Albumin: 3.6 g/dL (ref 3.5–5.0)
Alkaline Phosphatase: 50 U/L (ref 38–126)
Anion gap: 11 (ref 5–15)
BUN: 14 mg/dL (ref 8–23)
CO2: 29 mmol/L (ref 22–32)
Calcium: 8.4 mg/dL — ABNORMAL LOW (ref 8.9–10.3)
Chloride: 92 mmol/L — ABNORMAL LOW (ref 98–111)
Creatinine, Ser: 1.17 mg/dL (ref 0.61–1.24)
GFR, Estimated: >60 mL/min (ref >60)
Glucose, Bld: 134 mg/dL — ABNORMAL HIGH (ref 70–99)
Potassium: 3 mmol/L — ABNORMAL LOW (ref 3.5–5.1)
Sodium: 132 mmol/L — ABNORMAL LOW (ref 135–145)
Total Bilirubin: 0.6 mg/dL (ref 0.3–1.2)
Total Protein: 6.5 g/dL (ref 6.5–8.1)

## 2020-08-22 LAB — URINALYSIS, ROUTINE W REFLEX MICROSCOPIC
Bilirubin Urine: NEGATIVE
Glucose, UA: NEGATIVE mg/dL
Ketones, ur: NEGATIVE mg/dL
Leukocytes,Ua: NEGATIVE
Nitrite: POSITIVE — AB
Protein, ur: NEGATIVE mg/dL
Specific Gravity, Urine: <1.005 — ABNORMAL LOW (ref 1.005–1.030)
pH: 6 (ref 5.0–8.0)

## 2020-08-22 LAB — URINALYSIS, MICROSCOPIC (REFLEX)

## 2020-08-22 MED ORDER — PREDNISONE 20 MG PO TABS: 40.0000 mg | ORAL_TABLET | Freq: Every day | ORAL | 0 refills | Status: AC

## 2020-08-22 MED ORDER — OXYCODONE HCL 5 MG PO TABS
5.0000 mg | ORAL_TABLET | Freq: Once | ORAL | Status: AC
  Administered 2020-08-22: 5 mg via ORAL
  Filled 2020-08-22: qty 1

## 2020-08-22 MED ORDER — IOHEXOL 300 MG/ML  SOLN
100.0000 mL | Freq: Once | INTRAMUSCULAR | Status: AC | PRN
  Administered 2020-08-22: 100 mL via INTRAVENOUS

## 2020-08-22 MED ORDER — CEPHALEXIN 500 MG PO CAPS: 500.0000 mg | ORAL_CAPSULE | Freq: Three times a day (TID) | ORAL | 0 refills | Status: AC

## 2020-08-22 NOTE — ED Provider Notes (Signed)
[facility] Emergency Department Provider Note MRN:  [id number]  Arrival date & time: 08/22/20     Chief Complaint   Flank Pain   History of Present Illness   Dakota Fernandez is a 69 y.o. year-old male with a history of hypertension, MI presenting to the ED with chief complaint of flank pain.  Patient having burning with urination for the past 3 to 4 days, has been taking Azo because he feels like he has a UTI.  He has had this before.  Now for the past 1 or 2 days he is having pain in his lower back and left flank, much worse with motion, seems to radiate down the back of his left leg.  Denies fever or chills, no chest pain or shortness of breath, no abdominal pain, no numbness or weakness to the arms or legs, no bowel or bladder dysfunction other than burning.  Review of Systems  A complete 10 system review of systems was obtained and all systems are negative except as noted in the HPI and PMH.   Patient's Health History    Past Medical History:  Diagnosis Date  . Arthritis    oa both knees--pt states needs both knees replaced-plans right knee first  . Coronary artery disease   . Hypertension    recent problem with low b/p and heart rate while pt on metoprolol--was given  stress test at [place]--normal--pt's b/p med changed to lisinopril and pt feeling better and has ok for knee replacement with dr Charlann Boxer  . MI (myocardial infarction) [facility]     Past Surgical History:  Procedure Laterality Date  . ELBOW SURGERY     for ganglion cyst  . HAMMER TOE SURGERY     right foot  . right knee arthroscopy    . surgery for blood poisoning left arm     hx of splinter left little finger--that caused infection in left arm  . TOTAL KNEE ARTHROPLASTY  09/29/2011   Procedure: TOTAL KNEE ARTHROPLASTY;  Surgeon: [name];  Location: [facility] ORS;  Service: Orthopedics;  Laterality: Right;    No family history on file.  Social History   Socioeconomic History  .  Marital status: Married    Spouse name: Not on file  . Number of children: Not on file  . Years of education: Not on file  . Highest education level: Not on file  Occupational History  . Not on file  Tobacco Use  . Smoking status: Smoker, Current Status Unknown    Packs/day: 1.00    Years: 30.00    Pack years: 30.00    Types: Cigarettes  . Smokeless tobacco: Never Used  Substance and Sexual Activity  . Alcohol use: Yes    Comment: occas glass of wine or a beer  . Drug use: No  . Sexual activity: Not on file  Other Topics Concern  . Not on file  Social History Narrative  . Not on file   Social Determinants of Health   Financial Resource Strain:   . Difficulty of Paying Living Expenses: Not on file  Food Insecurity:   . Worried About Programme researcher, broadcasting/film/video in the Last Year: Not on file  . Ran Out of Food in the Last Year: Not on file  Transportation Needs:   . Lack of Transportation (Medical): Not on file  . Lack of Transportation (Non-Medical): Not on file  Physical Activity:   . Days of Exercise per Week: Not on  file  . Minutes of Exercise per Session: Not on file  Stress:   . Feeling of Stress : Not on file  Social Connections:   . Frequency of Communication with Friends and Family: Not on file  . Frequency of Social Gatherings with Friends and Family: Not on file  . Attends Religious Services: Not on file  . Active Member of Clubs or Organizations: Not on file  . Attends Banker Meetings: Not on file  . Marital Status: Not on file  Intimate Partner Violence:   . Fear of Current or Ex-Partner: Not on file  . Emotionally Abused: Not on file  . Physically Abused: Not on file  . Sexually Abused: Not on file     Physical Exam   Vitals:   08/22/20 1842 08/22/20 2026  BP: (!) 150/77 105/62  Pulse: 69 64  Resp: 20 19  Temp: 98.4 F (36.9 C)   SpO2: 96% 100%    CONSTITUTIONAL: Well-appearing, NAD NEURO:  Alert and oriented x 3, no focal  deficits EYES:  eyes equal and reactive ENT/NECK:  no LAD, no JVD CARDIO: Regular rate, well-perfused, normal S1 and S2 PULM:  CTAB no wheezing or rhonchi GI/GU:  normal bowel sounds, non-distended, non-tender MSK/SPINE:  No gross deformities, no edema SKIN:  no rash, atraumatic PSYCH:  Appropriate speech and behavior  *Additional and/or pertinent findings included in MDM below  Diagnostic and Interventional Summary    EKG Interpretation  Date/Time:    Ventricular Rate:    PR Interval:    QRS Duration:   QT Interval:    QTC Calculation:   R Axis:     Text Interpretation:        Labs Reviewed  URINALYSIS, ROUTINE W REFLEX MICROSCOPIC - Abnormal; Notable for the following components:      Result Value   Specific Gravity, Urine <1.005 (*)    Hgb urine dipstick TRACE (*)    Nitrite POSITIVE (*)    All other components within normal limits  CBC - Abnormal; Notable for the following components:   RBC 3.92 (*)    Hemoglobin 12.6 (*)    HCT 37.5 (*)    All other components within normal limits  COMPREHENSIVE METABOLIC PANEL - Abnormal; Notable for the following components:   Sodium 132 (*)    Potassium 3.0 (*)    Chloride 92 (*)    Glucose, Bld 134 (*)    Calcium 8.4 (*)    All other components within normal limits  URINALYSIS, MICROSCOPIC (REFLEX) - Abnormal; Notable for the following components:   Bacteria, UA RARE (*)    All other components within normal limits    CT ABDOMEN PELVIS W CONTRAST  Final Result    CT L-SPINE NO CHARGE  Final Result      Medications  oxyCODONE (Oxy IR/ROXICODONE) immediate release tablet 5 mg (5 mg Oral Given 08/22/20 1905)  iohexol (OMNIPAQUE) 300 MG/ML solution 100 mL (100 mLs Intravenous Contrast Given 08/22/20 1951)     Procedures  /  Critical Care Procedures  ED Course and Medical Decision Making  I have reviewed the triage vital signs, the nursing notes, and pertinent available records from the EMR.  Listed above are  laboratory and imaging tests that I personally ordered, reviewed, and interpreted and then considered in my medical decision making (see below for details).  Question of kidney stone versus pyelonephritis versus musculoskeletal back pain such as sciatica.  Kind of a mixed presentation.  His  pain seems much more musculoskeletal though he is having dysuria recently.  Reassuring vital sign and neurological exam.  Nothing to suggest myelopathy.  Urinalysis is nitrite positive, will treat with Keflex.  CT imaging is without acute pathology.  Will treat for sciatica given the radiation down the leg with prednisone burst, appropriate for discharge.       Elmer Sow. Pilar Plate, MD [facility] Emergency Medicine [facility] [email or web address]  Final Clinical Impressions(s) / ED Diagnoses     ICD-10-CM   1. Acute cystitis without hematuria  N30.00   2. Back pain  M54.9 CT L-SPINE NO CHARGE    CT L-SPINE NO CHARGE  3. Sciatica of left side  M54.32     ED Discharge Orders         Ordered    cephALEXin (KEFLEX) 500 MG capsule  3 times daily        08/22/20 2036    predniSONE (DELTASONE) 20 MG tablet  Daily        08/22/20 2036           Discharge Instructions Discussed with and Provided to Patient:     Discharge Instructions     You were evaluated in the Emergency Department and after careful evaluation, we did not find any emergent condition requiring admission or further testing in the hospital.  Your exam/testing today is overall reassuring.  Your urinary symptoms seem to be due to a bladder infection.  Please take the Keflex antibiotic as directed.  Your back seems to be more related to sciatica.  Please take the prednisone medication as directed to help with this pain.  Please return to the Emergency Department if you experience any worsening of your condition.   Thank you for allowing [place] to be a part of your care.      Sabas Sous, MD 08/22/20 2039

## 2020-08-22 NOTE — ED Triage Notes (Signed)
L flank pain x 3 days.

## 2020-08-22 NOTE — Discharge Instructions (Addendum)
You were evaluated in the Emergency Department and after careful evaluation, we did not find any emergent condition requiring admission or further testing in the hospital.  Your exam/testing today is overall reassuring.  Your urinary symptoms seem to be due to a bladder infection.  Please take the Keflex antibiotic as directed.  Your back seems to be more related to sciatica.  Please take the prednisone medication as directed to help with this pain.  Please return to the Emergency Department if you experience any worsening of your condition.   Thank you for allowing [place] to be a part of your care.

## 2021-07-20 IMAGING — CT CT L SPINE W/O CM
3 of 5 series · 12 of 33 positions shown, 14 images · IV contrast (Omnipaque)
Comparison: None.

CLINICAL DATA: Low back pain radiating to both legs

EXAM:
CT LUMBAR SPINE WITHOUT CONTRAST
TECHNIQUE: Multidetector CT imaging of the lumbar spine was performed without
intravenous contrast administration. Multiplanar CT image
reconstructions were also generated.

[Series 10: l spine cor bone · coronal · 0.29mm/px · 1 of 67 slices shown]
[im 34/67  bone]
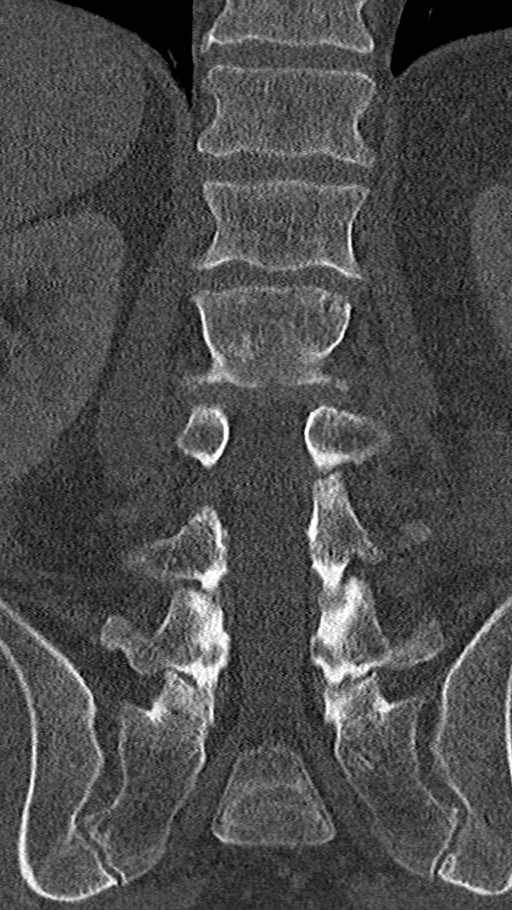

[Series 11: l spine st · axial · 0.31mm/px · z∈[-303,-131]mm · 6 of 113 slices shown, 8 images]
[im 18/113  soft-tissue]
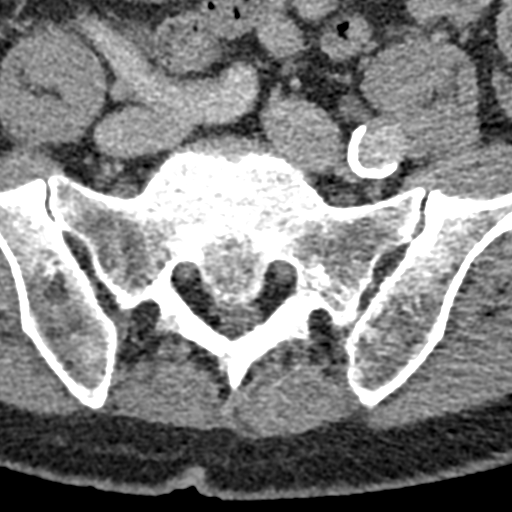
[im 18/113  bone]
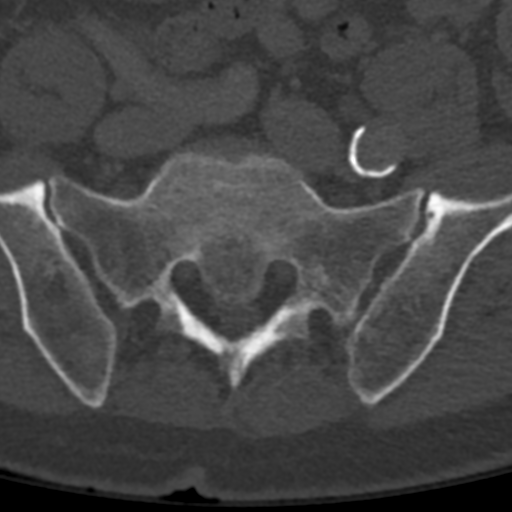
[im 35/113  bone]
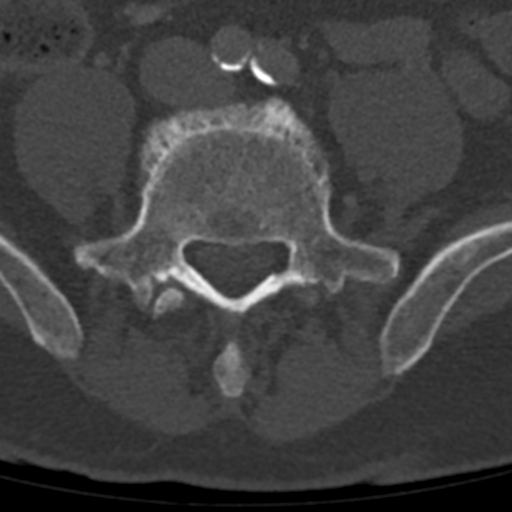
[im 52/113  bone]
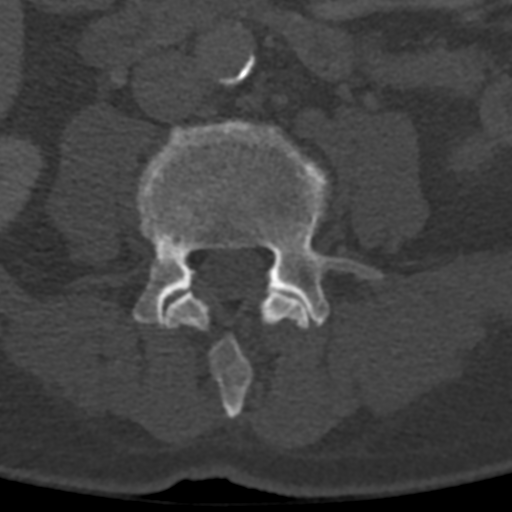
[im 69/113  bone]
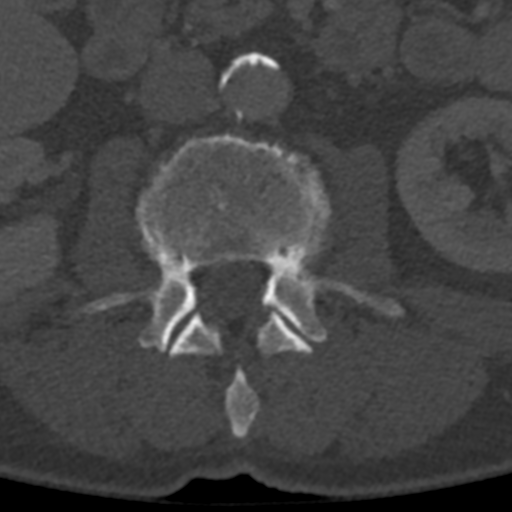
[im 87/113  soft-tissue]
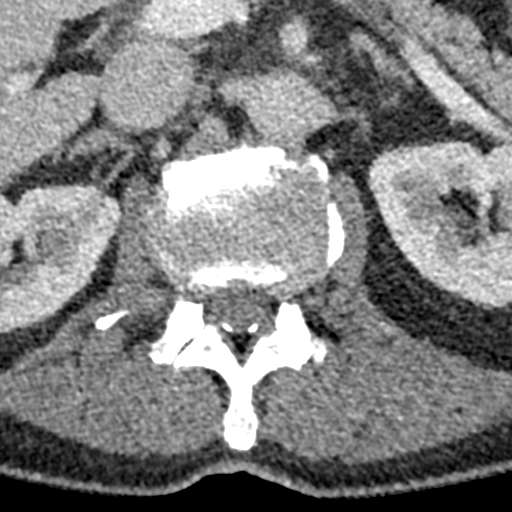
[im 87/113  bone]
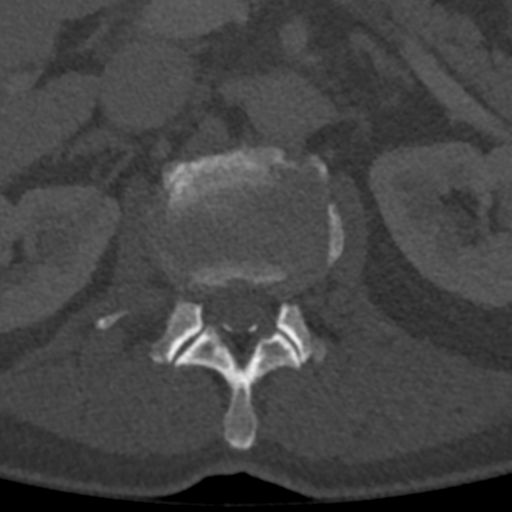
[im 104/113  bone]
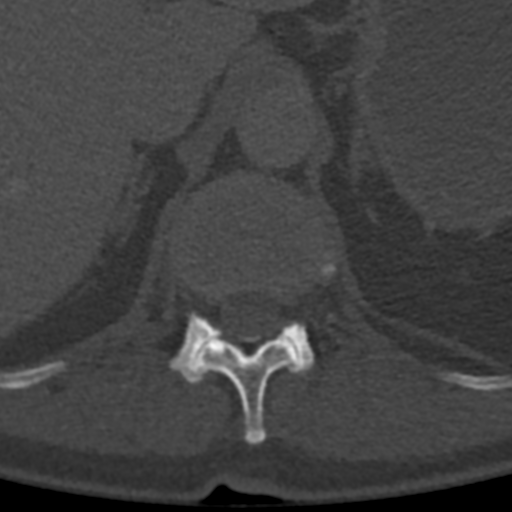

[Series 12: l spine st sag · sagittal · 0.25mm/px · 5 of 94 slices shown]
[im 16/94  bone]
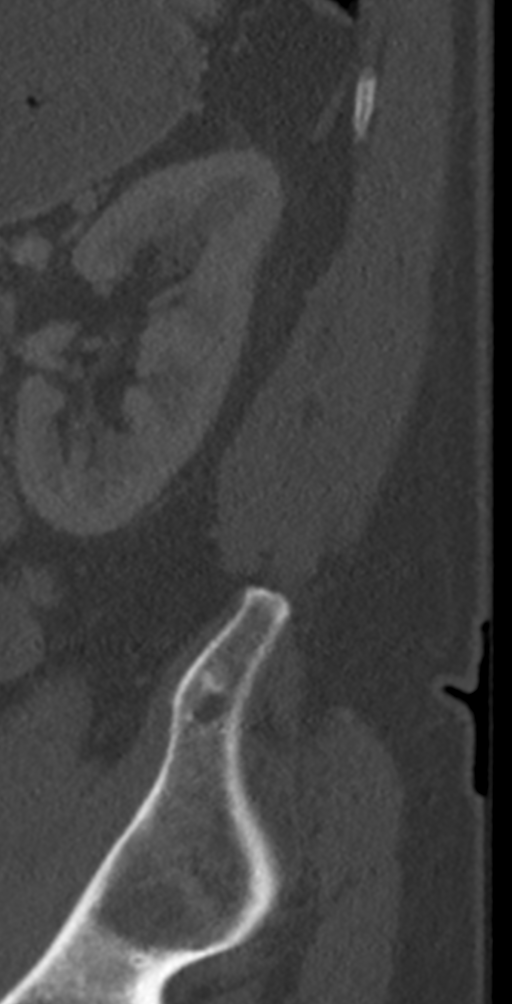
[im 32/94  bone]
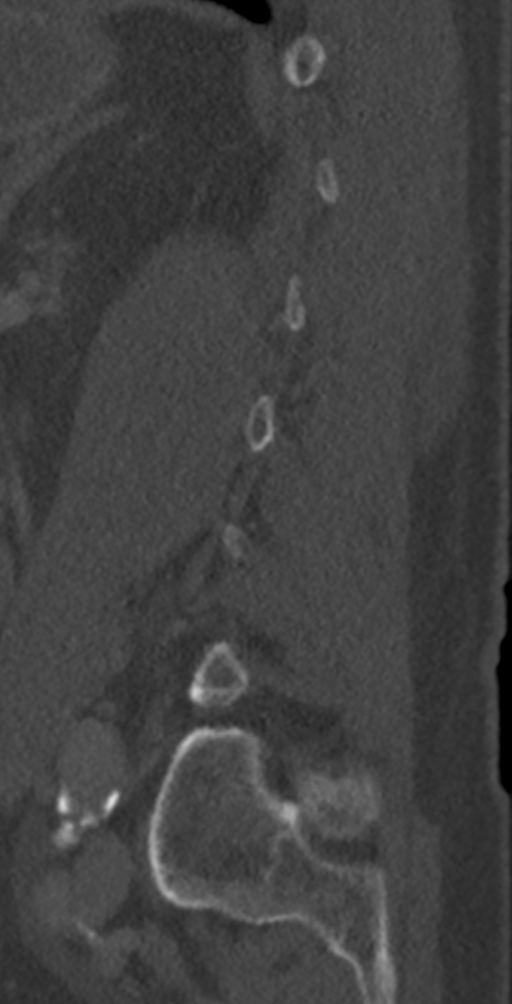
[im 47/94  bone]
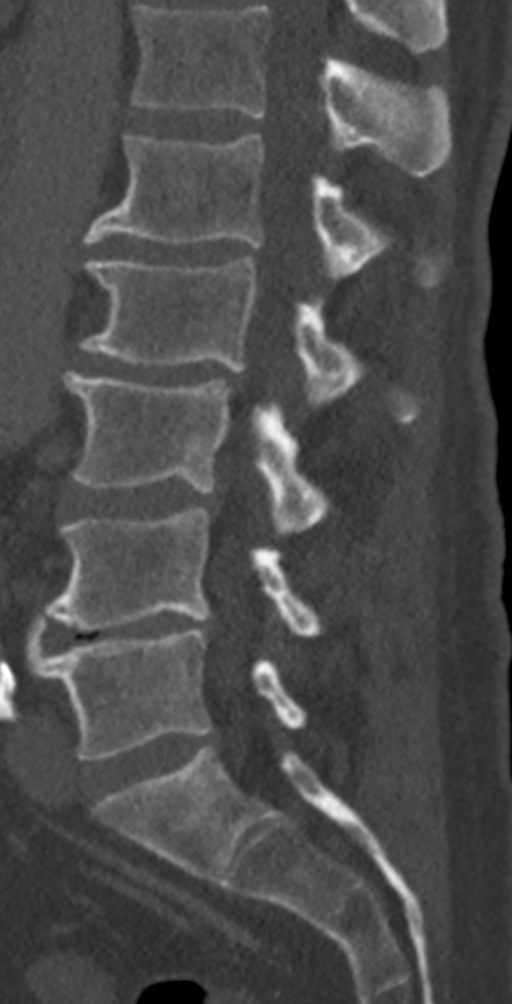
[im 63/94  bone]
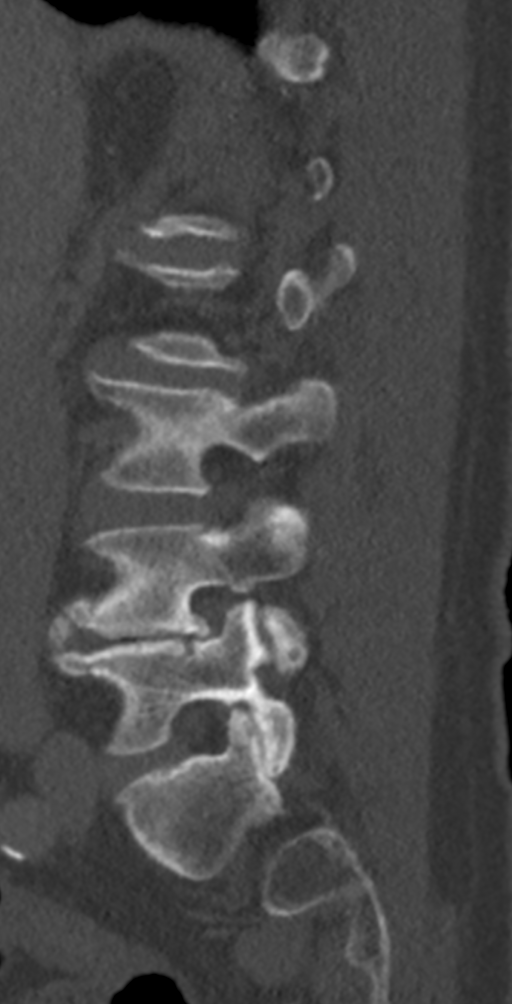
[im 78/94  bone]
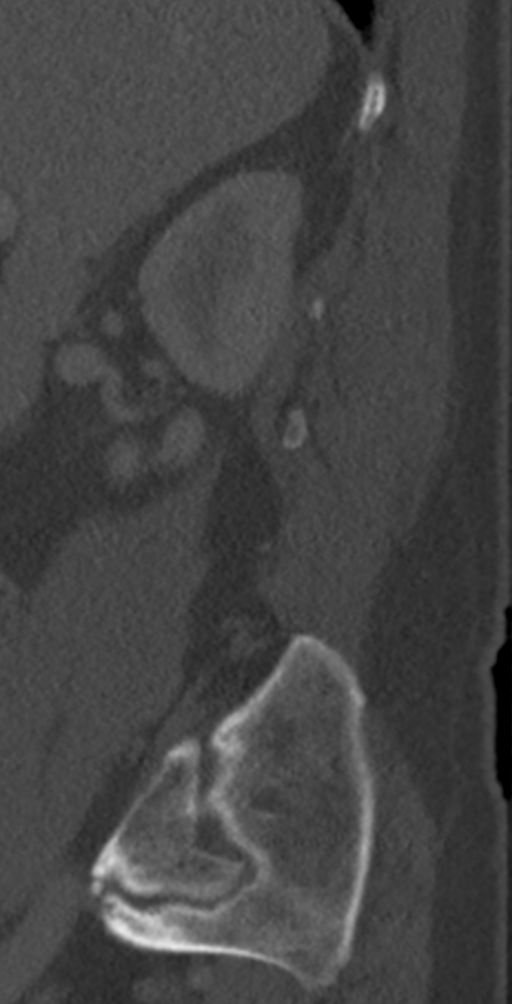

[12 of 33 positions shown; findings below may reference images not displayed]

FINDINGS: Segmentation: Standard

Alignment: Grade 1 retrolisthesis at L2-3

Vertebrae: No acute fracture or focal pathologic process.

Paraspinal and other soft tissues: Calcific aortic atherosclerosis.

Disc levels:

L1-2: Unremarkable.

Stenosis.  Mild left foraminal stenosis.

L3-4: Small disc bulge with mild left foraminal stenosis.

L4-5: Small disc bulge with mild facet hypertrophy. No spinal canal
or neural foraminal stenosis.

L5-S1: No disc herniation or stenosis.
IMPRESSION: 1. No acute fracture or static subluxation of the lumbar spine.
2. Mild left foraminal stenosis at L3-4 and L4-5.

Aortic Atherosclerosis ([id number]).

## 2021-12-09 ENCOUNTER — Ambulatory Visit: Payer: Self-pay | Admitting: Surgery

## 2022-04-21 ENCOUNTER — Ambulatory Visit: Payer: Self-pay | Admitting: Surgery

## 2022-04-21 NOTE — H&P (Signed)
Progress Notes - documented in this encounter Dakota Fernandez, [name], MD - 12/09/2021 9:40 AM EST Formatting of this note is different from the original. Images from the original note were not included.   REFERRING PHYSICIAN: Suella Grove*  PROVIDER: Hayden Rasmussen, MD  MRN: [id number] DOB: 15-Apr-1951 DATE OF ENCOUNTER: 12/09/2021 Subjective   Chief Complaint: Follow-up   History of Present Illness: Dakota Fernandez is a 71 y.o. male who is seen today as an office consultation for evaluation of Follow-up   Patient presents for evaluation of bilateral groin bulges. These have been present for a number of years but are getting larger and causing burning discomfort in both groins especially with exertion. He is a smoker and does have a chronic cough. He does relate some urinary issues with difficulty voiding and has to void 4-5 times a night. He has no blood in his urine. He has no back pain or suprapubic pain. He was felt to have bilateral inguinal hernias and is here today to discuss that. No nausea or vomiting.   Review of Systems: A complete review of systems was obtained from the patient. I have reviewed this information and discussed as appropriate with the patient. See HPI as well for other ROS.    Medical History: Past Medical History:  Diagnosis Date   Hypertension   There is no problem list on file for this patient.  Past Surgical History:  Procedure Laterality Date   pace maker   right knee   stint    No Known Allergies  Current Outpatient Medications on File Prior to Visit  Medication Sig Dispense Refill   rivaroxaban (XARELTO) 20 mg tablet Take 1 tablet (20 mg) by mouth daily with food   atorvastatin (LIPITOR) 10 MG tablet Take 10 mg by mouth once daily   lisinopriL (ZESTRIL) 2.5 MG tablet Take 20 mg by mouth every morning   oxyCODONE (DAZIDOX) 10 mg immediate release tablet Take 1 tablet (10 mg) by mouth every 8 hours as needed   No  current facility-administered medications on file prior to visit.   History reviewed. No pertinent family history.   Social History   Tobacco Use  Smoking Status Every Day   Types: Cigarettes  Smokeless Tobacco Never    Social History   Socioeconomic History   Marital status: Married  Tobacco Use   Smoking status: Every Day  Types: Cigarettes   Smokeless tobacco: Never  Substance and Sexual Activity   Alcohol use: Never   Drug use: Never   Objective:   Vitals:  12/09/21 0956  Pulse: 88  Temp: 36.5 C (97.7 F)  SpO2: 99%  Weight: 80.3 kg (177 lb)  Height: 180.3 cm (5\' 11" )   Body mass index is 24.69 kg/m.  Physical Exam Constitutional:  Appearance: Normal appearance.  Cardiovascular:  Rate and Rhythm: Normal rate.  Pulmonary:  Effort: Pulmonary effort is normal.  Breath sounds: No stridor.  Chest:   Abdominal:   Comments: BILATERAL REDUCIBLE INGUINAL HERNIA  Musculoskeletal:  General: Normal range of motion.  Skin: General: Skin is warm.  Neurological:  General: No focal deficit present.  Mental Status: He is alert.  Psychiatric:  Mood and Affect: Mood normal.  Behavior: Behavior normal.     Assessment and Plan:   Diagnoses and all orders for this visit:  Non-recurrent bilateral inguinal hernia without obstruction or gangrene  Voiding difficulty  Other orders - tamsulosin (FLOMAX) 0.4 mg capsule; Take 1 capsule (0.4 mg total) by mouth  once daily Take 30 minutes after same meal each day.    Discussed repair of his inguinal hernias. He would be a good candidate for laparoscopic repair since no previous abdominal surgery. He is on Xarelto and that was stopped as well as a cardiac evaluation and/or clearance due to his pacemaker. He does relates frequent voiding at night and sounds like he does have some element of prostatitis and/or prostamegaly. I explained his risk of postop urinary issues would be high therefore recommend Flomax to see if  this helps his symptoms and helps to alleviate the risk of urinary retention. I discussed laparoscopic and open approaches and the pros and cons of each. The use of mesh discussed. Complications of bleeding, infection, bladder injury, injury to the small bowel or large bowel, injury to vascular structures including nerves going to the lower extremities as well as recurrence rates. He is a smoker and explained his recurrence rates are only 10 times higher than the baseline population while he smokes. I also explained that his risk of chronic pain is 10% or greater if he continues to smoke. Also discussed wound healing complications while smoking and exacerbation of underlying problems including cardiovascular disease and potential death. Smoking cessation strongly encouraged. He will proceed with scheduling surgery once cardiac clearance is done and the plan will be a laparoscopic approach with mesh  No follow-ups on file.  Hayden Rasmussen, MD

## 2022-04-21 NOTE — Progress Notes (Signed)
Surgical Instructions    Your procedure is scheduled on Tuesday, July 25th, 2023.   Report to [facility] Main Entrance "A" at 09:00 A.M., then check in with the Admitting office.  Call this number if you have problems the morning of surgery:  [phone]   If you have any questions prior to your surgery date call [phone]: Open Monday-Friday 8am-4pm    Remember:  Do not eat after midnight the night before your surgery  You may drink clear liquids until 08:00 the morning of your surgery.   Clear liquids allowed are: Water, Non-Citrus Juices (without pulp), Carbonated Beverages, Clear Tea, Black Coffee ONLY (NO MILK, CREAM OR POWDERED CREAMER of any kind), and Gatorade    Take these medicines the morning of surgery with A SIP OF WATER:   atorvastatin (LIPITOR) carvedilol (COREG) tamsulosin (FLOMAX)   As needed:  cyclobenzaprine (FLEXERIL) nitroGLYCERIN (NITROSTAT) oxyCODONE-acetaminophen (PERCOCET)   Follow your surgeon's instructions on when to stop Aspirin and Xarelto.  If no instructions were given by your surgeon then you will need to call the office to get those instructions.     As of today, STOP taking any Aspirin (unless otherwise instructed by your surgeon) Aleve, Naproxen, Ibuprofen, Motrin, Advil, Goody's, BC's, all herbal medications, fish oil, and all vitamins.    The day of surgery:          Do not wear jewelry  Do not wear lotions, powders, colognes, or deodorant. Men may shave face and neck. Do not bring valuables to the hospital.  [facility] is not responsible for any belongings or valuables. .   Do NOT Smoke (Tobacco/Vaping)  24 hours prior to your procedure  If you use a CPAP at night, you may bring your mask for your overnight stay.   Contacts, glasses, hearing aids, dentures or partials may not be worn into surgery, please bring cases for these belongings   For patients admitted to the hospital, discharge time will be determined by your  treatment team.   Patients discharged the day of surgery will not be allowed to drive home, and someone needs to stay with them for 24 hours.   SURGICAL WAITING ROOM VISITATION Patients having surgery or a procedure may have no more than 2 support people in the waiting area - these visitors may rotate.   Children under the age of 4 must have an adult with them who is not the patient. If the patient needs to stay at the hospital during part of their recovery, the visitor guidelines for inpatient rooms apply. Pre-op nurse will coordinate an appropriate time for 1 support person to accompany patient in pre-op.  This support person may not rotate.   Please refer to the [facility] website for the visitor guidelines for Inpatients (after your surgery is over and you are in a regular room).    Special instructions:    Oral Hygiene is also important to reduce your risk of infection.  Remember - BRUSH YOUR TEETH THE MORNING OF SURGERY WITH YOUR REGULAR TOOTHPASTE   [place]- Preparing For Surgery  Before surgery, you can play an important role. Because skin is not sterile, your skin needs to be as free of germs as possible. You can reduce the number of germs on your skin by washing with CHG (chlorahexidine gluconate) Soap before surgery.  CHG is an antiseptic cleaner which kills germs and bonds with the skin to continue killing germs even after washing.     Please do not use  if you have an allergy to CHG or antibacterial soaps. If your skin becomes reddened/irritated stop using the CHG.  Do not shave (including legs and underarms) for at least 48 hours prior to first CHG shower. It is OK to shave your face.  Please follow these instructions carefully.     Shower the NIGHT BEFORE SURGERY and the MORNING OF SURGERY with CHG Soap.   If you chose to wash your hair, wash your hair first as usual with your normal shampoo. After you shampoo, rinse your hair and body thoroughly to remove the  shampoo.  Then [employer] and genitals (private parts) with your normal soap and rinse thoroughly to remove soap.  After that Use CHG Soap as you would any other liquid soap. You can apply CHG directly to the skin and wash gently with a scrungie or a clean washcloth.   Apply the CHG Soap to your body ONLY FROM THE NECK DOWN.  Do not use on open wounds or open sores. Avoid contact with your eyes, ears, mouth and genitals (private parts). Wash Face and genitals (private parts)  with your normal soap.   Wash thoroughly, paying special attention to the area where your surgery will be performed.  Thoroughly rinse your body with warm water from the neck down.  DO NOT shower/wash with your normal soap after using and rinsing off the CHG Soap.  Pat yourself dry with a CLEAN TOWEL.  Wear CLEAN PAJAMAS to bed the night before surgery  Place CLEAN SHEETS on your bed the night before your surgery  DO NOT SLEEP WITH PETS.   Day of Surgery:  Take a shower with CHG soap. Wear Clean/Comfortable clothing the morning of surgery Do not apply any deodorants/lotions.   Remember to brush your teeth WITH YOUR REGULAR TOOTHPASTE.    If you received a COVID test during your pre-op visit, it is requested that you wear a mask when out in public, stay away from anyone that may not be feeling well, and notify your surgeon if you develop symptoms. If you have been in contact with anyone that has tested positive in the last 10 days, please notify your surgeon.    Please read over the following fact sheets that you were given.

## 2022-04-21 NOTE — Progress Notes (Signed)
[employer] Rep was informed that this patient will have surgery on July 25th, at [facility]. Also, a request for pre-op device prog. order was sent to Dr. Clydie Braun.

## 2022-04-24 ENCOUNTER — Other Ambulatory Visit: Payer: Self-pay

## 2022-04-24 ENCOUNTER — Encounter (HOSPITAL_COMMUNITY): Payer: Self-pay

## 2022-04-24 ENCOUNTER — Encounter (HOSPITAL_COMMUNITY)
Admission: RE | Admit: 2022-04-24 | Discharge: 2022-04-24 | Disposition: A | Discharge status: Home / Self Care | Payer: Medicare HMO | Source: Ambulatory Visit | Attending: Surgery | Admitting: Surgery

## 2022-04-24 VITALS — BP 121/72 | HR 76 | Temp 97.8°F | Resp 18 | Ht 71.0 in | Wt 173.3 lb

## 2022-04-24 DIAGNOSIS — I10 Essential (primary) hypertension: Secondary | ICD-10-CM | POA: Diagnosis not present

## 2022-04-24 DIAGNOSIS — K4020 Bilateral inguinal hernia, without obstruction or gangrene, not specified as recurrent: Secondary | ICD-10-CM | POA: Diagnosis not present

## 2022-04-24 DIAGNOSIS — I420 Dilated cardiomyopathy: Secondary | ICD-10-CM | POA: Diagnosis not present

## 2022-04-24 DIAGNOSIS — Z01818 Encounter for other preprocedural examination: Secondary | ICD-10-CM | POA: Diagnosis present

## 2022-04-24 DIAGNOSIS — Z7901 Long term (current) use of anticoagulants: Secondary | ICD-10-CM | POA: Insufficient documentation

## 2022-04-24 DIAGNOSIS — I071 Rheumatic tricuspid insufficiency: Secondary | ICD-10-CM | POA: Diagnosis not present

## 2022-04-24 DIAGNOSIS — I495 Sick sinus syndrome: Secondary | ICD-10-CM | POA: Insufficient documentation

## 2022-04-24 DIAGNOSIS — Z7982 Long term (current) use of aspirin: Secondary | ICD-10-CM | POA: Insufficient documentation

## 2022-04-24 DIAGNOSIS — I2510 Atherosclerotic heart disease of native coronary artery without angina pectoris: Secondary | ICD-10-CM | POA: Diagnosis not present

## 2022-04-24 DIAGNOSIS — I480 Paroxysmal atrial fibrillation: Secondary | ICD-10-CM | POA: Diagnosis not present

## 2022-04-24 DIAGNOSIS — Z950 Presence of cardiac pacemaker: Secondary | ICD-10-CM | POA: Insufficient documentation

## 2022-04-24 DIAGNOSIS — Z955 Presence of coronary angioplasty implant and graft: Secondary | ICD-10-CM | POA: Insufficient documentation

## 2022-04-24 HISTORY — DX: Sick sinus syndrome: I49.5

## 2022-04-24 HISTORY — DX: Presence of cardiac pacemaker: Z95.0

## 2022-04-24 LAB — CBC
HCT: 41.2 % (ref 39.0–52.0)
Hemoglobin: 14.1 g/dL (ref 13.0–17.0)
MCH: 33.1 pg (ref 26.0–34.0)
MCHC: 34.2 g/dL (ref 30.0–36.0)
MCV: 96.7 fL (ref 80.0–100.0)
Platelets: 221 10*3/uL (ref 150–400)
RBC: 4.26 MIL/uL (ref 4.22–5.81)
RDW: 13.8 % (ref 11.5–15.5)
WBC: 6.7 10*3/uL (ref 4.0–10.5)
nRBC: 0 % (ref 0.0–0.2)

## 2022-04-24 LAB — BASIC METABOLIC PANEL
Anion gap: 9 (ref 5–15)
BUN: 13 mg/dL (ref 8–23)
CO2: 30 mmol/L (ref 22–32)
Calcium: 9.2 mg/dL (ref 8.9–10.3)
Chloride: 100 mmol/L (ref 98–111)
Creatinine, Ser: 1.18 mg/dL (ref 0.61–1.24)
GFR, Estimated: >60 mL/min (ref >60)
Glucose, Bld: 110 mg/dL — ABNORMAL HIGH (ref 70–99)
Potassium: 4.2 mmol/L (ref 3.5–5.1)
Sodium: 139 mmol/L (ref 135–145)

## 2022-04-24 NOTE — Progress Notes (Signed)
PCP - Suella Grove, MD Cardiologist - Clydie Braun, MD  PPM/ICD - yes Device Orders - request was send to Dr. Clydie Braun Rep Notified - yes  Chest x-ray - n/a EKG - 04/24/2022 Stress Test - April 2023 ECHO - 01/06/2022 Cardiac Cath - denies  Sleep Study - denies CPAP - n/a  Fasting Blood Sugar - n/a  Blood Thinner Instructions: Xarelto - last dose - 04/29/2022 per patient Aspirin Instructions: last dose - 04/29/2022 per patient  Patient was instructed: As of today, STOP taking any Aspirin (unless otherwise instructed by your surgeon) Aleve, Naproxen, Ibuprofen, Motrin, Advil, Goody's, BC's, all herbal medications, fish oil, and all vitamins.    ERAS Protcol - yes, until 08:00 o'clock  COVID TEST- n/a   Anesthesia review: yes - cardiac history; pacemaker  Patient denies shortness of breath, fever, cough and chest pain at PAT appointment   All instructions explained to the patient, with a verbal understanding of the material. Patient agrees to go over the instructions while at home for a better understanding. Patient also instructed to self quarantine after being tested for COVID-19. The opportunity to ask questions was provided.

## 2022-04-25 ENCOUNTER — Encounter (HOSPITAL_COMMUNITY): Payer: Self-pay

## 2022-04-25 NOTE — Progress Notes (Signed)
Anesthesia Chart Review:   Case: [id number] Date/Time: 05/02/22 1045   Procedure: LAPAROSCOPIC BILATERAL INGUINAL HERNIA REPAIR WITH MESH (Bilateral)   Anesthesia type: General   Pre-op diagnosis: BILATERAL INGUINAL HERNIA   [facility] ROOM [facility] / [facility]   Surgeons: Harriette Bouillon, MD       DISCUSSION: Pt is 71 years old with hx CAD (by notes, prior stenting to LAD and D1 in 2014), dilated cardiomyopathy (EF 60-65% on 01/12/22 echo), PAF, HTN, sick sinus syndrome, s/p  pacemaker ([employer], implanted 2017)  Last dose xarelto and ASA 04/29/22  Perioperative prescription for pacemaker is pending. Has been requested twice.    VS: BP 121/72   Pulse 76   Temp 36.6 C (Oral)   Resp 18   Ht 5\' 11"  (1.803 m)   Wt 78.6 kg   SpO2 98%   BMI 24.17 kg/m   PROVIDERS: - PCP is , MD - Cardiologist is Lerry Liner, MD. Last office visit 04/05/22 with 04/07/22, NP (notes in care everywhere)    LABS: Labs reviewed: Acceptable for surgery. (all labs ordered are listed, but only abnormal results are displayed)  Labs Reviewed  BASIC METABOLIC PANEL - Abnormal; Notable for the following components:      Result Value   Glucose, Bld 110 (*)    All other components within normal limits  CBC    EKG 04/24/22: Atrial-paced rhythm with prolonged AV conduction. Low voltage QRS. Cannot rule out Anterior infarct , age undetermined   CV: Nuclear stress test 01/17/2022 (Dr. 03/19/2022 office): 1.  Pharmacologic stress nuclear study is normal 2.  Gated study shows EF was 50-55% 3.  There is reverse redistribution of the inferior and inferoseptal walls which is of unknown clinical significance.  No obvious ischemia on this study.  Echo 01/12/22 (care everywhere):  - Image Quality: Technically difficult.  - The left ventricular size is normal. There is normal left ventricular wall thickness. Left ventricular systolic function is normal. LV ejection fraction = 60-65%.  Well-preserved LV systolic function with mild apical hypokinesis and an asynchronous contraction pattern of the left ventricle.  - The right ventricle is mildly dilated.  - Device lead in the right ventricle  - The right atrium is mildly dilated.  - There is mild tricuspid regurgitation.  - Estimated right ventricular systolic pressure is 32 mmHg.     Past Medical History:  Diagnosis Date   Arthritis    oa both knees--pt states needs both knees replaced-plans right knee first   Coronary artery disease    Hypertension    recent problem with low b/p and heart rate while pt on metoprolol--was given  stress test at [place]--normal--pt's b/p med changed to lisinopril and pt feeling better and has ok for knee replacement with dr 03/14/22   MI (myocardial infarction) [facility])    Presence of permanent cardiac pacemaker    Sick sinus syndrome [facility])     Past Surgical History:  Procedure Laterality Date   ELBOW SURGERY     for ganglion cyst   HAMMER TOE SURGERY     right foot   JOINT REPLACEMENT     right knee arthroscopy     surgery for blood poisoning left arm     hx of splinter left little finger--that caused infection in left arm   TONSILLECTOMY     TOTAL KNEE ARTHROPLASTY  09/29/2011   Procedure: TOTAL KNEE ARTHROPLASTY;  Surgeon: 10/01/2011;  Location: [facility] ORS;  Service: Orthopedics;  Laterality: Right;    MEDICATIONS:  aspirin EC 81 MG tablet   atorvastatin (LIPITOR) 40 MG tablet   carvedilol (COREG) 25 MG tablet   cyclobenzaprine (FLEXERIL) 10 MG tablet   lisinopril-hydrochlorothiazide (ZESTORETIC) 10-12.5 MG tablet   Melatonin 10 MG TABS   nitroGLYCERIN (NITROSTAT) 0.4 MG SL tablet   oxyCODONE-acetaminophen (PERCOCET) 10-325 MG tablet   rivaroxaban (XARELTO) 20 MG TABS tablet   tamsulosin (FLOMAX) 0.4 MG CAPS capsule   No current facility-administered medications for this encounter.    If no changes, I anticipate pt can proceed with surgery as scheduled.   Rica Mast,  PhD, FNP-BC [facility]/Anesthesiology Phone: [phone] 04/25/2022 12:05 PM

## 2022-04-25 NOTE — Anesthesia Preprocedure Evaluation (Addendum)
Anesthesia Evaluation  Patient identified by MRN, date of birth, ID band Patient awake    Reviewed: Allergy & Precautions, NPO status , Patient's Chart, lab work & pertinent test results, reviewed documented beta blocker date and time   History of Anesthesia Complications Negative for: history of anesthetic complications  Airway Mallampati: II  TM Distance: >3 FB Neck ROM: Full    Dental  (+) Poor Dentition, Missing, Dental Advisory Given, Chipped   Pulmonary Current SmokerPatient did not abstain from smoking.,    breath sounds clear to auscultation       Cardiovascular hypertension, Pt. on medications and Pt. on home beta blockers (-) angina+ CAD (angioplasty), + Past MI and + Cardiac Stents  + dysrhythmias Atrial Fibrillation + pacemaker (for SSS)  Rhythm:Regular Rate:Normal  01/2022 ECHO: Left ventricular systolic function is normal.  LV ejection fraction = 60-65%.  Well-preserved LV systolic function with mild apical hypokinesis and an asynchronous contraction pattern of the left ventricle.  The right ventricle is mildly dilated.  Device lead in the right ventricle  The right atrium is mildly dilated.  There is mild tricuspid regurgitation  01/2022 Stress: negative for any reversible ischemia    Neuro/Psych negative neurological ROS     GI/Hepatic negative GI ROS, Neg liver ROS,   Endo/Other  negative endocrine ROS  Renal/GU negative Renal ROS     Musculoskeletal  (+) Arthritis ,   Abdominal   Peds  Hematology xarelto   Anesthesia Other Findings   Reproductive/Obstetrics                           Anesthesia Physical Anesthesia Plan  ASA: 3  Anesthesia Plan: General   Post-op Pain Management: Tylenol PO (pre-op)*   Induction: Intravenous  PONV Risk Score and Plan: 2 and Ondansetron and Dexamethasone  Airway Management Planned: Oral ETT  Additional Equipment: None  Intra-op  Plan:   Post-operative Plan: Extubation in OR  Informed Consent: I have reviewed the patients History and Physical, chart, labs and discussed the procedure including the risks, benefits and alternatives for the proposed anesthesia with the patient or authorized representative who has indicated his/her understanding and acceptance.     Dental advisory given  Plan Discussed with: CRNA and Surgeon  Anesthesia Plan Comments: (See APP note by Joslyn Hy, FNP  Dr. Luisa Hart will do the TAP blocks intraop)      Anesthesia Quick Evaluation

## 2022-05-02 ENCOUNTER — Encounter (HOSPITAL_COMMUNITY)
Admission: RE | Disposition: A | Discharge status: Home / Self Care | Payer: Self-pay | Source: Ambulatory Visit | Attending: Surgery

## 2022-05-02 ENCOUNTER — Other Ambulatory Visit: Payer: Self-pay

## 2022-05-02 ENCOUNTER — Encounter (HOSPITAL_COMMUNITY): Payer: Self-pay | Admitting: Surgery

## 2022-05-02 ENCOUNTER — Ambulatory Visit (HOSPITAL_COMMUNITY)
Admission: RE | Admit: 2022-05-02 | Discharge: 2022-05-02 | Disposition: A | Discharge status: Home / Self Care | Payer: Medicare HMO | Source: Ambulatory Visit | Attending: Surgery | Admitting: Surgery

## 2022-05-02 ENCOUNTER — Ambulatory Visit (HOSPITAL_COMMUNITY): Payer: Medicare HMO | Admitting: Emergency Medicine

## 2022-05-02 ENCOUNTER — Ambulatory Visit (HOSPITAL_BASED_OUTPATIENT_CLINIC_OR_DEPARTMENT_OTHER): Payer: Medicare HMO | Admitting: Certified Registered"

## 2022-05-02 DIAGNOSIS — I4891 Unspecified atrial fibrillation: Secondary | ICD-10-CM | POA: Diagnosis not present

## 2022-05-02 DIAGNOSIS — I10 Essential (primary) hypertension: Secondary | ICD-10-CM | POA: Diagnosis not present

## 2022-05-02 DIAGNOSIS — F17210 Nicotine dependence, cigarettes, uncomplicated: Secondary | ICD-10-CM | POA: Diagnosis not present

## 2022-05-02 DIAGNOSIS — K4020 Bilateral inguinal hernia, without obstruction or gangrene, not specified as recurrent: Secondary | ICD-10-CM | POA: Insufficient documentation

## 2022-05-02 DIAGNOSIS — I2510 Atherosclerotic heart disease of native coronary artery without angina pectoris: Secondary | ICD-10-CM

## 2022-05-02 DIAGNOSIS — Z950 Presence of cardiac pacemaker: Secondary | ICD-10-CM | POA: Diagnosis not present

## 2022-05-02 DIAGNOSIS — Z01818 Encounter for other preprocedural examination: Secondary | ICD-10-CM

## 2022-05-02 HISTORY — PX: INSERTION OF MESH: SHX5868

## 2022-05-02 HISTORY — PX: INGUINAL HERNIA REPAIR: SHX194

## 2022-05-02 SURGERY — REPAIR, HERNIA, INGUINAL, BILATERAL, LAPAROSCOPIC
Anesthesia: General | Site: Groin | Laterality: Bilateral

## 2022-05-02 MED ORDER — PROMETHAZINE HCL 25 MG/ML IJ SOLN: 6.2500 mg | INTRAMUSCULAR | Status: DC | PRN

## 2022-05-02 MED ORDER — PHENYLEPHRINE 80 MCG/ML (10ML) SYRINGE FOR IV PUSH (FOR BLOOD PRESSURE SUPPORT)
PREFILLED_SYRINGE | INTRAVENOUS | Status: DC | PRN
  Administered 2022-05-02: 160 ug via INTRAVENOUS
  Administered 2022-05-02: 80 ug via INTRAVENOUS
  Administered 2022-05-02: 160 ug via INTRAVENOUS
  Administered 2022-05-02: 240 ug via INTRAVENOUS

## 2022-05-02 MED ORDER — MEPERIDINE HCL 25 MG/ML IJ SOLN: 6.2500 mg | INTRAMUSCULAR | Status: DC | PRN

## 2022-05-02 MED ORDER — PHENYLEPHRINE HCL (PRESSORS) 10 MG/ML IV SOLN
INTRAVENOUS | Status: AC
  Filled 2022-05-02: qty 1

## 2022-05-02 MED ORDER — CHLORHEXIDINE GLUCONATE 0.12 % MT SOLN
OROMUCOSAL | Status: AC
  Administered 2022-05-02: 15 mL via OROMUCOSAL
  Filled 2022-05-02: qty 15

## 2022-05-02 MED ORDER — EPHEDRINE 5 MG/ML INJ
INTRAVENOUS | Status: AC
  Filled 2022-05-02: qty 10

## 2022-05-02 MED ORDER — PHENYLEPHRINE 80 MCG/ML (10ML) SYRINGE FOR IV PUSH (FOR BLOOD PRESSURE SUPPORT)
PREFILLED_SYRINGE | INTRAVENOUS | Status: AC
Start: 2022-05-02 — End: ?
  Filled 2022-05-02: qty 30

## 2022-05-02 MED ORDER — METHOCARBAMOL 500 MG PO TABS: 500.0000 mg | ORAL_TABLET | Freq: Three times a day (TID) | ORAL | 0 refills | Status: AC | PRN

## 2022-05-02 MED ORDER — 0.9 % SODIUM CHLORIDE (POUR BTL) OPTIME
TOPICAL | Status: DC | PRN
  Administered 2022-05-02: 1000 mL

## 2022-05-02 MED ORDER — FENTANYL CITRATE (PF) 250 MCG/5ML IJ SOLN
INTRAMUSCULAR | Status: AC
  Filled 2022-05-02: qty 5

## 2022-05-02 MED ORDER — OXYCODONE HCL 5 MG PO TABS: 5.0000 mg | ORAL_TABLET | Freq: Once | ORAL | Status: DC | PRN

## 2022-05-02 MED ORDER — ROCURONIUM BROMIDE 100 MG/10ML IV SOLN
INTRAVENOUS | Status: DC | PRN
  Administered 2022-05-02 (×2): 10 mg via INTRAVENOUS
  Administered 2022-05-02: 60 mg via INTRAVENOUS

## 2022-05-02 MED ORDER — CEFAZOLIN IN SODIUM CHLORIDE 3-0.9 GM/100ML-% IV SOLN
INTRAVENOUS | Status: AC
  Filled 2022-05-02: qty 100

## 2022-05-02 MED ORDER — BUPIVACAINE-EPINEPHRINE (PF) 0.25% -1:200000 IJ SOLN
INTRAMUSCULAR | Status: AC
  Filled 2022-05-02: qty 30

## 2022-05-02 MED ORDER — ORAL CARE MOUTH RINSE: 15.0000 mL | Freq: Once | OROMUCOSAL | Status: AC

## 2022-05-02 MED ORDER — MIDAZOLAM HCL 2 MG/2ML IJ SOLN: 0.5000 mg | Freq: Once | INTRAMUSCULAR | Status: DC | PRN

## 2022-05-02 MED ORDER — CEFAZOLIN IN SODIUM CHLORIDE 3-0.9 GM/100ML-% IV SOLN: 3.0000 g | INTRAVENOUS | Status: DC

## 2022-05-02 MED ORDER — FENTANYL CITRATE (PF) 250 MCG/5ML IJ SOLN
INTRAMUSCULAR | Status: DC | PRN
  Administered 2022-05-02 (×3): 50 ug via INTRAVENOUS

## 2022-05-02 MED ORDER — ONDANSETRON HCL 4 MG/2ML IJ SOLN
INTRAMUSCULAR | Status: DC | PRN
  Administered 2022-05-02: 4 mg via INTRAVENOUS

## 2022-05-02 MED ORDER — SUGAMMADEX SODIUM 200 MG/2ML IV SOLN
INTRAVENOUS | Status: DC | PRN
  Administered 2022-05-02: 150 mg via INTRAVENOUS

## 2022-05-02 MED ORDER — LIDOCAINE 2% (20 MG/ML) 5 ML SYRINGE
INTRAMUSCULAR | Status: AC
  Filled 2022-05-02: qty 5

## 2022-05-02 MED ORDER — PHENYLEPHRINE HCL-NACL 20-0.9 MG/250ML-% IV SOLN
INTRAVENOUS | Status: DC | PRN
  Administered 2022-05-02: 50 ug/min via INTRAVENOUS

## 2022-05-02 MED ORDER — OXYCODONE HCL 5 MG PO TABS: 10.0000 mg | ORAL_TABLET | ORAL | 0 refills | Status: AC | PRN

## 2022-05-02 MED ORDER — PROPOFOL 10 MG/ML IV BOLUS
INTRAVENOUS | Status: AC
  Filled 2022-05-02: qty 20

## 2022-05-02 MED ORDER — CHLORHEXIDINE GLUCONATE CLOTH 2 % EX PADS: 6.0000 | MEDICATED_PAD | Freq: Once | CUTANEOUS | Status: DC

## 2022-05-02 MED ORDER — SODIUM CHLORIDE 0.9 % IV SOLN
INTRAVENOUS | Status: DC | PRN
  Administered 2022-05-02: 40 mL

## 2022-05-02 MED ORDER — MIDAZOLAM HCL 2 MG/2ML IJ SOLN
INTRAMUSCULAR | Status: AC
  Filled 2022-05-02: qty 2

## 2022-05-02 MED ORDER — ALBUTEROL SULFATE HFA 108 (90 BASE) MCG/ACT IN AERS
INHALATION_SPRAY | RESPIRATORY_TRACT | Status: AC
Start: 2022-05-02 — End: ?
  Filled 2022-05-02: qty 6.7

## 2022-05-02 MED ORDER — PROPOFOL 10 MG/ML IV BOLUS
INTRAVENOUS | Status: DC | PRN
  Administered 2022-05-02: 100 mg via INTRAVENOUS

## 2022-05-02 MED ORDER — ALBUTEROL SULFATE HFA 108 (90 BASE) MCG/ACT IN AERS
INHALATION_SPRAY | RESPIRATORY_TRACT | Status: DC | PRN
  Administered 2022-05-02: 2 via RESPIRATORY_TRACT

## 2022-05-02 MED ORDER — ROCURONIUM BROMIDE 10 MG/ML (PF) SYRINGE
PREFILLED_SYRINGE | INTRAVENOUS | Status: AC
  Filled 2022-05-02: qty 10

## 2022-05-02 MED ORDER — CHLORHEXIDINE GLUCONATE 0.12 % MT SOLN: 15.0000 mL | Freq: Once | OROMUCOSAL | Status: AC

## 2022-05-02 MED ORDER — OXYCODONE HCL 5 MG/5ML PO SOLN: 5.0000 mg | Freq: Once | ORAL | Status: DC | PRN

## 2022-05-02 MED ORDER — DEXAMETHASONE SODIUM PHOSPHATE 10 MG/ML IJ SOLN
INTRAMUSCULAR | Status: DC | PRN
  Administered 2022-05-02: 10 mg via INTRAVENOUS

## 2022-05-02 MED ORDER — CEFAZOLIN SODIUM-DEXTROSE 2-3 GM-%(50ML) IV SOLR
INTRAVENOUS | Status: DC | PRN
  Administered 2022-05-02: 2 g via INTRAVENOUS

## 2022-05-02 MED ORDER — MIDAZOLAM HCL 5 MG/5ML IJ SOLN
INTRAMUSCULAR | Status: DC | PRN
  Administered 2022-05-02: 1 mg via INTRAVENOUS

## 2022-05-02 MED ORDER — ONDANSETRON HCL 4 MG/2ML IJ SOLN
INTRAMUSCULAR | Status: AC
  Filled 2022-05-02: qty 2

## 2022-05-02 MED ORDER — BUPIVACAINE-EPINEPHRINE 0.25% -1:200000 IJ SOLN
INTRAMUSCULAR | Status: DC | PRN
  Administered 2022-05-02: 5 mL

## 2022-05-02 MED ORDER — HYDROMORPHONE HCL 1 MG/ML IJ SOLN: 0.2500 mg | INTRAMUSCULAR | Status: DC | PRN

## 2022-05-02 MED ORDER — ACETAMINOPHEN 500 MG PO TABS
ORAL_TABLET | ORAL | Status: AC
  Filled 2022-05-02: qty 2

## 2022-05-02 MED ORDER — BUPIVACAINE LIPOSOME 1.3 % IJ SUSP
INTRAMUSCULAR | Status: AC
Start: 2022-05-02 — End: ?
  Filled 2022-05-02: qty 20

## 2022-05-02 MED ORDER — LACTATED RINGERS IV SOLN: INTRAVENOUS | Status: DC

## 2022-05-02 MED ORDER — ACETAMINOPHEN 500 MG PO TABS
1000.0000 mg | ORAL_TABLET | Freq: Once | ORAL | Status: AC
  Administered 2022-05-02: 1000 mg via ORAL

## 2022-05-02 MED ORDER — LIDOCAINE 2% (20 MG/ML) 5 ML SYRINGE
INTRAMUSCULAR | Status: DC | PRN
  Administered 2022-05-02: 40 mg via INTRAVENOUS

## 2022-05-02 MED ORDER — FENTANYL CITRATE (PF) 100 MCG/2ML IJ SOLN
INTRAMUSCULAR | Status: AC
  Filled 2022-05-02: qty 2

## 2022-05-02 MED ORDER — EPHEDRINE SULFATE-NACL 50-0.9 MG/10ML-% IV SOSY
PREFILLED_SYRINGE | INTRAVENOUS | Status: DC | PRN
  Administered 2022-05-02: 10 mg via INTRAVENOUS
  Administered 2022-05-02: 5 mg via INTRAVENOUS
  Administered 2022-05-02: 10 mg via INTRAVENOUS

## 2022-05-02 SURGICAL SUPPLY — 43 items
BAG COUNTER SPONGE SURGICOUNT (BAG) ×3 IMPLANT
BLADE CLIPPER SURG (BLADE) IMPLANT
BLADE SURG 10 STRL SS (BLADE) ×1 IMPLANT
CANISTER SUCT 3000ML PPV (MISCELLANEOUS) IMPLANT
CHLORAPREP W/TINT 26 (MISCELLANEOUS) ×3 IMPLANT
COVER SURGICAL LIGHT HANDLE (MISCELLANEOUS) ×3 IMPLANT
DERMABOND ADVANCED (GAUZE/BANDAGES/DRESSINGS) ×1
DERMABOND ADVANCED .7 DNX12 (GAUZE/BANDAGES/DRESSINGS) ×2 IMPLANT
DISSECT BALLN SPACEMKR + OVL (BALLOONS) ×3
DISSECTOR BALLN SPACEMKR + OVL (BALLOONS) ×2 IMPLANT
DRAIN PENROSE .5X12 LATEX STL (DRAIN) ×1 IMPLANT
DRAPE LAPAROSCOPIC ABDOMINAL (DRAPES) ×3 IMPLANT
ELECT REM PT RETURN 9FT ADLT (ELECTROSURGICAL) ×3
ELECTRODE REM PT RTRN 9FT ADLT (ELECTROSURGICAL) ×2 IMPLANT
GLOVE BIO SURGEON STRL SZ8 (GLOVE) ×3 IMPLANT
GLOVE BIOGEL PI IND STRL 8 (GLOVE) ×2 IMPLANT
GLOVE BIOGEL PI INDICATOR 8 (GLOVE) ×1
GOWN STRL REUS W/ TWL LRG LVL3 (GOWN DISPOSABLE) ×6 IMPLANT
GOWN STRL REUS W/ TWL XL LVL3 (GOWN DISPOSABLE) ×2 IMPLANT
GOWN STRL REUS W/TWL LRG LVL3 (GOWN DISPOSABLE) ×3
GOWN STRL REUS W/TWL XL LVL3 (GOWN DISPOSABLE) ×1
KIT BASIN OR (CUSTOM PROCEDURE TRAY) ×3 IMPLANT
KIT TURNOVER KIT B (KITS) ×3 IMPLANT
MESH HERNIA SYS ULTRAPRO LRG (Mesh General) ×2 IMPLANT
NS IRRIG 1000ML POUR BTL (IV SOLUTION) ×3 IMPLANT
PAD ARMBOARD 7.5X6 YLW CONV (MISCELLANEOUS) ×6 IMPLANT
PENCIL SMOKE EVACUATOR (MISCELLANEOUS) ×1 IMPLANT
SET TUBE SMOKE EVAC HIGH FLOW (TUBING) ×3 IMPLANT
SUT MNCRL AB 4-0 PS2 18 (SUTURE) ×4 IMPLANT
SUT NOVA NAB GS-21 0 18 T12 DT (SUTURE) ×3 IMPLANT
SUT NOVA T20/GS 25 (SUTURE) ×1 IMPLANT
SUT VIC AB 2-0 SH 27 (SUTURE) ×2
SUT VIC AB 2-0 SH 27X BRD (SUTURE) IMPLANT
SUT VIC AB 3-0 SH 18 (SUTURE) ×1 IMPLANT
TOWEL GREEN STERILE (TOWEL DISPOSABLE) ×3 IMPLANT
TOWEL GREEN STERILE FF (TOWEL DISPOSABLE) ×3 IMPLANT
TRAY FOLEY W/BAG SLVR 14FR LF (SET/KITS/TRAYS/PACK) ×1 IMPLANT
TRAY LAPAROSCOPIC MC (CUSTOM PROCEDURE TRAY) ×3 IMPLANT
TROCAR XCEL BLADELESS 5X75MML (TROCAR) ×6 IMPLANT
TUBE CONNECTING 12X1/4 (SUCTIONS) ×1 IMPLANT
WARMER LAPAROSCOPE (MISCELLANEOUS) ×3 IMPLANT
WATER STERILE IRR 1000ML POUR (IV SOLUTION) ×3 IMPLANT
YANKAUER SUCT BULB TIP NO VENT (SUCTIONS) ×1 IMPLANT

## 2022-05-02 NOTE — Discharge Instructions (Addendum)
[facility] _______[facility]  UMBILICAL OR INGUINAL HERNIA REPAIR: POST OP INSTRUCTIONS  Always review your discharge instruction sheet given to you by the facility where your surgery was performed. IF YOU HAVE DISABILITY OR FAMILY LEAVE FORMS, YOU MUST BRING THEM TO THE OFFICE FOR PROCESSING.   DO NOT GIVE THEM TO YOUR DOCTOR.  1. A  prescription for pain medication may be given to you upon discharge.  Take your pain medication as prescribed, if needed.  If narcotic pain medicine is not needed, then you may take acetaminophen (Tylenol) or ibuprofen (Advil) as needed. 2. Take your usually prescribed medications unless otherwise directed. If you need a refill on your pain medication, please contact your pharmacy.  They will contact our office to request authorization. Prescriptions will not be filled after 5 pm or on week-ends. 3. You should follow a light diet the first 24 hours after arrival home, such as soup and crackers, etc.  Be sure to include lots of fluids daily.  Resume your normal diet the day after surgery. 4.Most patients will experience some swelling and bruising around the umbilicus or in the groin and scrotum.  Ice packs and reclining will help.  Swelling and bruising can take several days to resolve.  6. It is common to experience some constipation if taking pain medication after surgery.  Increasing fluid intake and taking a stool softener (such as Colace) will usually help or prevent this problem from occurring.  A mild laxative (Milk of Magnesia or Miralax) should be taken according to package directions if there are no bowel movements after 48 hours. 7. Unless discharge instructions indicate otherwise, you may remove your bandages 24-48 hours after surgery, and you may shower at that time.  You may have steri-strips (small skin tapes) in place directly over the incision.  These strips should be left on the skin for 7-10 days.  If your surgeon used skin glue on the  incision, you may shower in 24 hours.  The glue will flake off over the next 2-3 weeks.  Any sutures or staples will be removed at the office during your follow-up visit. 8. ACTIVITIES:  You may resume regular (light) daily activities beginning the next day--such as daily self-care, walking, climbing stairs--gradually increasing activities as tolerated.  You may have sexual intercourse when it is comfortable.  Refrain from any heavy lifting or straining until approved by your doctor.  a.You may drive when you are no longer taking prescription pain medication, you can comfortably wear a seatbelt, and you can safely maneuver your car and apply brakes. b.RETURN TO WORK:   _____________________________________________  9.You should see your doctor in the office for a follow-up appointment approximately 2-3 weeks after your surgery.  Make sure that you call for this appointment within a day or two after you arrive home to insure a convenient appointment time. 10.OTHER INSTRUCTIONS: _________________________    _____________________________________  WHEN TO CALL YOUR DOCTOR: Fever over 101.0 Inability to urinate Nausea and/or vomiting Extreme swelling or bruising Continued bleeding from incision. Increased pain, redness, or drainage from the incision  The clinic staff is available to answer your questions during regular business hours.  Please don't hesitate to call and ask to speak to one of the nurses for clinical concerns.  If you have a medical emergency, go to the nearest emergency room or call 911.  A surgeon from [facility] is always on call at the hospital   [address], [address],   [place], [place]  [address] ?  [address] [address], [place], [place]   [address] [phone] ? [phone] ? FAX [phone] Web site: [email or web address]         Restart xaralto in 48 hours

## 2022-05-02 NOTE — Interval H&P Note (Signed)
History and Physical Interval Note:  05/02/2022 10:40 AM  Dakota Fernandez  has presented today for surgery, with the diagnosis of BILATERAL INGUINAL HERNIA.  The various methods of treatment have been discussed with the patient and family. After consideration of risks, benefits and other options for treatment, the patient has consented to  Procedure(s): LAPAROSCOPIC BILATERAL INGUINAL HERNIA REPAIR WITH MESH (Bilateral) as a surgical intervention.  The patient's history has been reviewed, patient examined, no change in status, stable for surgery.  I have reviewed the patient's chart and labs.  Questions were answered to the patient's satisfaction.     Roslind Michaux A Garey Alleva   

## 2022-05-02 NOTE — Anesthesia Procedure Notes (Signed)
Procedure Name: Intubation Date/Time: 05/02/2022 11:30 AM  Performed by: Marny Lowenstein, CRNAPre-anesthesia Checklist: Patient identified, Emergency Drugs available, Suction available and Patient being monitored Patient Re-evaluated:Patient Re-evaluated prior to induction Oxygen Delivery Method: Circle system utilized Preoxygenation: Pre-oxygenation with 100% oxygen Induction Type: IV induction Ventilation: Mask ventilation without difficulty and Oral airway inserted - appropriate to patient size Laryngoscope Size: Hyacinth Meeker and 2 Grade View: Grade II Tube type: Oral Tube size: 8.0 mm Number of attempts: 1 Airway Equipment and Method: Patient positioned with wedge pillow and Stylet Placement Confirmation: positive ETCO2 and breath sounds checked- equal and bilateral Secured at: 23 cm Tube secured with: Tape Dental Injury: Teeth and Oropharynx as per pre-operative assessment

## 2022-05-02 NOTE — H&P (Signed)
Chief Complaint: Follow-up   History of Present Illness: Dakota Fernandez is a 71 y.o. male who is seen today for preop check. He is scheduled for laparoscopic possible open bilateral inguinal hernia tomorrow. He was seen 4 months ago. He has not been seen since. He is doing well. He has cardiac clearance and is holding his anticoagulation. He has no new complaints except for some burning around his hernias..    Review of Systems: A complete review of systems was obtained from the patient. I have reviewed this information and discussed as appropriate with the patient. See HPI as well for other ROS.  ROS   Medical History: Past Medical History:  Diagnosis Date  Hypertension   There is no problem list on file for this patient.  Past Surgical History:  Procedure Laterality Date  pace maker  right knee  stint    No Known Allergies  Current Outpatient Medications on File Prior to Visit  Medication Sig Dispense Refill  atorvastatin (LIPITOR) 10 MG tablet Take 10 mg by mouth once daily  lisinopriL (ZESTRIL) 2.5 MG tablet Take 20 mg by mouth every morning  oxyCODONE (DAZIDOX) 10 mg immediate release tablet Take 1 tablet (10 mg) by mouth every 8 hours as needed  rivaroxaban (XARELTO) 20 mg tablet Take 1 tablet (20 mg) by mouth daily with food  tamsulosin (FLOMAX) 0.4 mg capsule Take 1 capsule (0.4 mg total) by mouth once daily Take 30 minutes after same meal each day. 30 capsule 11   No current facility-administered medications on file prior to visit.   No family history on file.   Social History   Tobacco Use  Smoking Status Every Day  Types: Cigarettes  Smokeless Tobacco Never    Social History   Socioeconomic History  Marital status: Married  Tobacco Use  Smoking status: Every Day  Types: Cigarettes  Smokeless tobacco: Never  Substance and Sexual Activity  Alcohol use: Never  Drug use: Never   Objective:   There were no vitals filed for this visit.  There is  no height or weight on file to calculate BMI.  Abdomen: Soft nontender. Small reducible bilateral inguinal hernia.  Assessment and Plan:   Diagnoses and all orders for this visit:  Non-recurrent bilateral inguinal hernia without obstruction or gangrene    Discussed laparoscopic and open approaches again with mesh use. Discussed chronic pain and the risk of that with smoking which she has not stopped. He has been cleared from a cardiac standpoint. We discussed potential open surgery as well. Risk of bleeding, infection, major blood vessel injury, nerve injury, bowel injury, bladder injury, chronic pain especially with continued tobacco use and cardiovascular complications and rarely death. He understands and agrees to proceed. Postop care also discussed today as well.  No follow-ups on file.  Hayden Rasmussen, MD

## 2022-05-02 NOTE — Interval H&P Note (Signed)
History and Physical Interval Note:  05/02/2022 10:40 AM  Dakota Fernandez  has presented today for surgery, with the diagnosis of BILATERAL INGUINAL HERNIA.  The various methods of treatment have been discussed with the patient and family. After consideration of risks, benefits and other options for treatment, the patient has consented to  Procedure(s): LAPAROSCOPIC BILATERAL INGUINAL HERNIA REPAIR WITH MESH (Bilateral) as a surgical intervention.  The patient's history has been reviewed, patient examined, no change in status, stable for surgery.  I have reviewed the patient's chart and labs.  Questions were answered to the patient's satisfaction.     Morey Andonian A Soniya Ashraf

## 2022-05-02 NOTE — Op Note (Signed)
Preoperative diagnosis: Bilateral reducible inguinal hernia  Postoperative diagnosis: Same  Procedure: Laparoscopic converted to open bilateral inguinal hernia repair with mesh  Surgeon: Erroll Luna, MD    Assistant: Dr Clerance Lav MD  I was personally present during the key and critical portions of this procedure and immediately available throughout the entire procedure, as documented in my operative note.       Anesthesia: General with Exparel 20 cc diluted by 20 cc saline for total 40 cc for nerve block and local anesthetic  Drains: None  Specimen: None  IV fluids: Per anesthesia record  Indications for procedure: The patient is a 71 year old male who presents for repair of symptomatic bilateral inguinal hernia.  We discussed laparoscopic and open approaches.  The pros and cons of each were discussed he chose to undergo a laparoscopic inguinal hernia pair after discussion of both the above.  We discussed potential complications of bleeding, infection, organ injury, bladder injury, bowel injury, conversion to open surgery, injury to nerves, chronic pain, and recurrence.  He continues to smoke and was advised to stop since this will increase his risk of chronic pain as well as hernia recurrence risk.The risk of hernia repair include bleeding,  Infection,   Recurrence of the hernia,  Mesh use, chronic pain,  Organ injury,  Bowel injury,  Bladder injury,   nerve injury with numbness around the incision,  Death,  and worsening of preexisting  medical problems.  The alternatives to surgery have been discussed as well..  Long term expectations of both operative and non operative treatments have been discussed.   The patient agrees to proceed.    Description of procedure: The patient was met in the holding area and questions were answered.  He was then brought to the operating.  He is placed supine upon the OR table.  After induction of general esthesia, Foley catheter was placed  under sterile conditions.  There were no immediate complications of this.  His abdomen was prepped and draped in sterile fashion after taking both arms.  Timeout performed.  He received preoperative antibiotic prophylaxis.  A 1 cm incision was made just below the umbilicus.  Dissection was carried down until the left rectus muscle was identified.  The anterior sheath was opened and the muscle fibers were swept anteriorly and laterally.  Posterior sheath was identified.  I was then able to place Spacemaker balloon in the space.  Initially the balloon would not function properly.  The balloon would not inflate properly.  We were able to then get the balloon to work to create a space.  Upon insertion of the laparoscope, we could not create an adequate space or maintain an adequate amount of air in the space to visualize the anatomy clearly.  Multiple attempts were made to adjust the set up to help with that but despite multiple attempts we could not see clearly to perform the operation laparoscopically.  At this point I felt it would make more sense to convert to an open repair since we could not visualize the anatomy clearly.  There is no immediate complication from this.  I removed the port.  There is no bleeding from the preperitoneal space.  I then closed the anterior sheath was interrupted 2-0 Vicryl.  The right side was done first.  Local anesthetic of Exparel was infiltrated the skin.  We also injected and did a block of the ilioinguinal nerve proximal to that.  Incision was made in the right groin.  Dissection  was carried down through Scarpa's fascia.  The aponeurosis of the external bleak was identified.  The fibers were opened through the external ring.  The cord structures were encircled.  He had a moderate-sized direct hernia.  There were reduced reduces back into the preperitoneal space.  Of note the ilioinguinal nerve was identified and a neurectomy was performed to prevent postoperative pain.  The  nerve was divided as it exited the muscle.  A large piece of the ultra pro hernia system was used.  The inner leaflet was placed into the prepared space.  The onlay was placed on the floor of the inguinal canal.  A slit was cut the cord structures.  The mesh was secured with #1 and 0 Novafil sutures to the shelving edge of the ligament, the conjoined tendon, the tibial tubercle.  A slit was cut to allow passage of the core sugars.  This was closed around the cord structures with no undue tension and I could place the tip my fifth digit 3.  The mesh laid flat on the floor.  We then checked for bleeding there is no signs of bleeding.  We then closed the aponeurosis of the external oblique with 2-0 Vicryl.  3-0 Vicryl was used to close Scarpa's fascia.  4 Monocryl used to close skin in a subcuticular fashion.  The left side was then done next.  In similar fashion local anesthetic was infiltrated in the skin.  An oblique incision was made left inguinal crease.  Dissection was carried down through Scarpa's fascia to the aponeurosis left external oblique was identified.  The fibers were opened through the external ring.  Cord structures encircled the cord range Penrose drain.  He also had a large direct hernia on the left side.  I was able to dissect this off the cord structures.  In a similar fashion neurectomy of the ilioinguinal nerve was performed on the left side.  A large piece of the ultra pro hernia system was used with the inner leaflet placed in the preperitoneal space and the onlay placed on the floor the inguinal canal.  A slit was cut the cord structures.  Secured to the shelving edge of the inguinal ligament, pubic tubercle and conjoined tendon with interrupted 2-0 Novafil suture.  The mesh was closed carefully and core structures not to impinge them.  This laid nicely with no bleeding.  The aponeurosis of the external bleak was closed.  Of note Exparel was injected for local anesthetic.  A total of 40 cc  of the 20 cc diluted by 20 cc solution used.  We then closed Scarpa's fascia with 3-0 Vicryl and 4 Monocryl was used to close the skin in a subcuticular fashion.  Umbilical port site closed with 4-0 Monocryl.  Dermabond applied to all incisions.  Urine was clear at this point of the case the Foley was removed.  No evidence of any immediate complications.  All counts were correct the patient was awoke extubated taken recovery in satisfactory condition.

## 2022-05-02 NOTE — Transfer of Care (Signed)
Immediate Anesthesia Transfer of Care Note  Patient: Dakota Fernandez  Procedure(s) Performed: ATTEMPTED LAPAROSCOPIC BILATERAL INGUINAL HERNIA REPAIR WITH MESH (Bilateral: Abdomen) BILATERAL INGUINAL HERNIA REPAIR (Bilateral: Groin) INSERTION OF MESH (Bilateral: Groin)  Patient Location: PACU  Anesthesia Type:General  Level of Consciousness: drowsy and patient cooperative  Airway & Oxygen Therapy: Patient Spontanous Breathing and Patient connected to face mask oxygen  Post-op Assessment: Report given to RN and Post -op Vital signs reviewed and stable  Post vital signs: Reviewed and stable  Last Vitals:  Vitals Value Taken Time  BP 97/64 05/02/22 1347  Temp    Pulse 60 05/02/22 1350  Resp 11 05/02/22 1350  SpO2 97 % 05/02/22 1350  Vitals shown include unvalidated device data.  Last Pain:  Vitals:   05/02/22 0928  TempSrc:   PainSc: 6       Patients Stated Pain Goal: 3 (05/02/22 7939)  Complications: No notable events documented.

## 2022-05-03 ENCOUNTER — Encounter (HOSPITAL_COMMUNITY): Payer: Self-pay | Admitting: Surgery

## 2022-05-03 NOTE — Anesthesia Postprocedure Evaluation (Signed)
Anesthesia Post Note  Patient: Dakota Fernandez  Procedure(s) Performed: ATTEMPTED LAPAROSCOPIC BILATERAL INGUINAL HERNIA REPAIR WITH MESH (Bilateral: Abdomen) BILATERAL INGUINAL HERNIA REPAIR (Bilateral: Groin) INSERTION OF MESH (Bilateral: Groin)     Patient location during evaluation: PACU Anesthesia Type: General Level of consciousness: awake and alert Pain management: pain level controlled Vital Signs Assessment: post-procedure vital signs reviewed and stable Respiratory status: spontaneous breathing, nonlabored ventilation, respiratory function stable and patient connected to nasal cannula oxygen Cardiovascular status: blood pressure returned to baseline and stable Postop Assessment: no apparent nausea or vomiting Anesthetic complications: no   No notable events documented.  Last Vitals:  Vitals:   05/02/22 1420 05/02/22 1435  BP: 121/77 120/75  Pulse: 60 62  Resp: 13 14  Temp:  36.5 C  SpO2: 97% 95%    Last Pain:  Vitals:   05/02/22 1435  TempSrc:   PainSc: 0-No pain                 Earl Lites P Lorne Winkels

## 2023-02-07 ENCOUNTER — Other Ambulatory Visit: Payer: Self-pay

## 2023-02-07 DIAGNOSIS — M79661 Pain in right lower leg: Secondary | ICD-10-CM

## 2023-02-22 ENCOUNTER — Ambulatory Visit: Payer: Medicare HMO

## 2023-03-27 ENCOUNTER — Ambulatory Visit: Payer: Medicare HMO

## 2023-04-25 ENCOUNTER — Ambulatory Visit: Payer: Medicare HMO | Attending: Cardiology

## 2023-04-25 DIAGNOSIS — M79661 Pain in right lower leg: Secondary | ICD-10-CM | POA: Diagnosis not present

## 2023-04-25 DIAGNOSIS — M79662 Pain in left lower leg: Secondary | ICD-10-CM

## 2023-05-01 ENCOUNTER — Telehealth: Payer: Self-pay

## 2023-05-01 NOTE — Telephone Encounter (Signed)
Spoke with Dakota Fernandez,(wife) notified of results

## 2023-05-01 NOTE — Telephone Encounter (Signed)
-----   Message from Gypsy Balsam sent at 05/01/2023 12:39 PM EDT ----- ABIs normal bilaterally

## 2024-10-15 DIAGNOSIS — Z950 Presence of cardiac pacemaker: Secondary | ICD-10-CM | POA: Diagnosis not present

## 2024-10-15 DIAGNOSIS — R9431 Abnormal electrocardiogram [ECG] [EKG]: Secondary | ICD-10-CM | POA: Diagnosis not present
